# Patient Record
Sex: Male | Born: 1960 | Race: White | Hispanic: No | Marital: Married | Smoking: Former smoker
Health system: Southern US, Community
[De-identification: ages and names within clinical notes are randomized; demographics above are authoritative.]

## PROBLEM LIST (undated history)

## (undated) DIAGNOSIS — N200 Calculus of kidney: Secondary | ICD-10-CM

## (undated) DIAGNOSIS — I10 Essential (primary) hypertension: Secondary | ICD-10-CM

## (undated) DIAGNOSIS — Z87442 Personal history of urinary calculi: Secondary | ICD-10-CM

## (undated) DIAGNOSIS — I499 Cardiac arrhythmia, unspecified: Secondary | ICD-10-CM

## (undated) DIAGNOSIS — IMO0002 Reserved for concepts with insufficient information to code with codable children: Secondary | ICD-10-CM

## (undated) DIAGNOSIS — E785 Hyperlipidemia, unspecified: Secondary | ICD-10-CM

## (undated) DIAGNOSIS — M179 Osteoarthritis of knee, unspecified: Secondary | ICD-10-CM

## (undated) DIAGNOSIS — M1710 Unilateral primary osteoarthritis, unspecified knee: Secondary | ICD-10-CM

## (undated) DIAGNOSIS — K219 Gastro-esophageal reflux disease without esophagitis: Secondary | ICD-10-CM

## (undated) HISTORY — PX: FRACTURE SURGERY: SHX138

## (undated) HISTORY — DX: Osteoarthritis of knee, unspecified: M17.9

## (undated) HISTORY — DX: Hyperlipidemia, unspecified: E78.5

## (undated) HISTORY — PX: FOOT SURGERY: SHX648

## (undated) HISTORY — DX: Rider (driver) (passenger) of other motorcycle injured in unspecified traffic accident, initial encounter: V29.99XA

## (undated) HISTORY — PX: BACK SURGERY: SHX140

## (undated) HISTORY — DX: Unilateral primary osteoarthritis, unspecified knee: M17.10

## (undated) HISTORY — DX: Cardiac arrhythmia, unspecified: I49.9

## (undated) HISTORY — DX: Reserved for concepts with insufficient information to code with codable children: IMO0002

---

## 1999-03-19 ENCOUNTER — Ambulatory Visit (HOSPITAL_COMMUNITY): Admission: RE | Admit: 1999-03-19 | Discharge: 1999-03-19 | Payer: Self-pay | Admitting: Orthopedic Surgery

## 1999-03-19 ENCOUNTER — Encounter: Payer: Self-pay | Admitting: Orthopedic Surgery

## 1999-04-12 ENCOUNTER — Encounter: Payer: Self-pay | Admitting: Orthopedic Surgery

## 1999-04-12 ENCOUNTER — Observation Stay (HOSPITAL_COMMUNITY): Admission: RE | Admit: 1999-04-12 | Discharge: 1999-04-13 | Payer: Self-pay | Admitting: Orthopedic Surgery

## 2000-01-18 ENCOUNTER — Ambulatory Visit (HOSPITAL_COMMUNITY): Admission: RE | Admit: 2000-01-18 | Discharge: 2000-01-18 | Payer: Self-pay | Admitting: Urology

## 2000-10-25 ENCOUNTER — Ambulatory Visit (HOSPITAL_COMMUNITY): Admission: RE | Admit: 2000-10-25 | Discharge: 2000-10-25 | Payer: Self-pay | Admitting: Orthopedic Surgery

## 2003-07-22 ENCOUNTER — Ambulatory Visit (HOSPITAL_COMMUNITY): Admission: RE | Admit: 2003-07-22 | Discharge: 2003-07-22 | Payer: Self-pay | Admitting: Orthopedic Surgery

## 2003-08-20 ENCOUNTER — Ambulatory Visit (HOSPITAL_BASED_OUTPATIENT_CLINIC_OR_DEPARTMENT_OTHER): Admission: RE | Admit: 2003-08-20 | Discharge: 2003-08-20 | Payer: Self-pay | Admitting: Orthopedic Surgery

## 2003-12-04 ENCOUNTER — Ambulatory Visit (HOSPITAL_BASED_OUTPATIENT_CLINIC_OR_DEPARTMENT_OTHER): Admission: RE | Admit: 2003-12-04 | Discharge: 2003-12-04 | Payer: Self-pay | Admitting: Orthopedic Surgery

## 2010-10-25 ENCOUNTER — Ambulatory Visit (HOSPITAL_COMMUNITY)
Admission: RE | Admit: 2010-10-25 | Discharge: 2010-10-25 | Disposition: A | Discharge status: Home / Self Care | Payer: Commercial Managed Care - PPO | Source: Ambulatory Visit | Attending: Urology | Admitting: Urology

## 2010-10-25 DIAGNOSIS — Z01818 Encounter for other preprocedural examination: Secondary | ICD-10-CM | POA: Insufficient documentation

## 2010-10-25 DIAGNOSIS — N200 Calculus of kidney: Secondary | ICD-10-CM | POA: Insufficient documentation

## 2010-10-25 DIAGNOSIS — K219 Gastro-esophageal reflux disease without esophagitis: Secondary | ICD-10-CM | POA: Insufficient documentation

## 2010-10-25 DIAGNOSIS — I10 Essential (primary) hypertension: Secondary | ICD-10-CM | POA: Insufficient documentation

## 2010-10-29 ENCOUNTER — Emergency Department (HOSPITAL_COMMUNITY)
Admission: EM | Admit: 2010-10-29 | Discharge: 2010-10-29 | Disposition: A | Discharge status: Home / Self Care | Payer: Commercial Managed Care - PPO | Attending: Emergency Medicine | Admitting: Emergency Medicine

## 2010-10-29 ENCOUNTER — Emergency Department (HOSPITAL_COMMUNITY): Payer: Commercial Managed Care - PPO

## 2010-10-29 DIAGNOSIS — N200 Calculus of kidney: Secondary | ICD-10-CM | POA: Insufficient documentation

## 2010-10-29 DIAGNOSIS — R109 Unspecified abdominal pain: Secondary | ICD-10-CM | POA: Insufficient documentation

## 2010-10-29 DIAGNOSIS — Z9889 Other specified postprocedural states: Secondary | ICD-10-CM | POA: Insufficient documentation

## 2010-10-29 LAB — URINALYSIS, ROUTINE W REFLEX MICROSCOPIC
Ketones, ur: NEGATIVE mg/dL
Leukocytes, UA: NEGATIVE
Nitrite: NEGATIVE
Specific Gravity, Urine: 1.021 (ref 1.005–1.030)
Urobilinogen, UA: 0.2 mg/dL (ref 0.0–1.0)
pH: 5 (ref 5.0–8.0)

## 2010-10-29 LAB — URINE MICROSCOPIC-ADD ON

## 2011-01-30 ENCOUNTER — Other Ambulatory Visit: Payer: Self-pay

## 2011-01-30 ENCOUNTER — Emergency Department (HOSPITAL_COMMUNITY): Payer: Commercial Managed Care - PPO

## 2011-01-30 ENCOUNTER — Inpatient Hospital Stay (HOSPITAL_COMMUNITY)
Admission: EM | Admit: 2011-01-30 | Discharge: 2011-02-01 | DRG: 493 | Disposition: A | Discharge status: Home / Self Care | Payer: Commercial Managed Care - PPO | Attending: General Surgery | Admitting: General Surgery

## 2011-01-30 DIAGNOSIS — S82143A Displaced bicondylar fracture of unspecified tibia, initial encounter for closed fracture: Secondary | ICD-10-CM | POA: Diagnosis present

## 2011-01-30 DIAGNOSIS — S060X9A Concussion with loss of consciousness of unspecified duration, initial encounter: Secondary | ICD-10-CM

## 2011-01-30 DIAGNOSIS — Y92410 Unspecified street and highway as the place of occurrence of the external cause: Secondary | ICD-10-CM

## 2011-01-30 DIAGNOSIS — S82209A Unspecified fracture of shaft of unspecified tibia, initial encounter for closed fracture: Secondary | ICD-10-CM

## 2011-01-30 DIAGNOSIS — S82109A Unspecified fracture of upper end of unspecified tibia, initial encounter for closed fracture: Principal | ICD-10-CM | POA: Diagnosis present

## 2011-01-30 DIAGNOSIS — S060XAA Concussion with loss of consciousness status unknown, initial encounter: Secondary | ICD-10-CM | POA: Diagnosis present

## 2011-01-30 LAB — POCT I-STAT, CHEM 8
Calcium, Ion: 1.11 mmol/L — ABNORMAL LOW (ref 1.12–1.32)
Chloride: 102 mEq/L (ref 96–112)
Creatinine, Ser: 0.8 mg/dL (ref 0.50–1.35)
Glucose, Bld: 109 mg/dL — ABNORMAL HIGH (ref 70–99)
HCT: 47 % (ref 39.0–52.0)

## 2011-01-30 LAB — COMPREHENSIVE METABOLIC PANEL
ALT: 43 U/L (ref 0–53)
Alkaline Phosphatase: 71 U/L (ref 39–117)
BUN: 18 mg/dL (ref 6–23)
CO2: 25 mEq/L (ref 19–32)
Chloride: 98 mEq/L (ref 96–112)
GFR calc Af Amer: >90 mL/min (ref >90)
GFR calc non Af Amer: >90 mL/min (ref >90)
Glucose, Bld: 99 mg/dL (ref 70–99)
Potassium: 3 mEq/L — ABNORMAL LOW (ref 3.5–5.1)
Sodium: 135 mEq/L (ref 135–145)
Total Bilirubin: 0.5 mg/dL (ref 0.3–1.2)
Total Protein: 7.6 g/dL (ref 6.0–8.3)

## 2011-01-30 LAB — CARDIAC PANEL(CRET KIN+CKTOT+MB+TROPI)
Relative Index: 2.9 — ABNORMAL HIGH (ref 0.0–2.5)
Troponin I: <0.3 ng/mL (ref <0.30)

## 2011-01-30 LAB — CBC
HCT: 43.7 % (ref 39.0–52.0)
Hemoglobin: 15.8 g/dL (ref 13.0–17.0)
MCHC: 36.2 g/dL — ABNORMAL HIGH (ref 30.0–36.0)
RBC: 5.16 MIL/uL (ref 4.22–5.81)

## 2011-01-30 MED ORDER — FENTANYL CITRATE 0.05 MG/ML IJ SOLN
50.0000 ug | Freq: Once | INTRAMUSCULAR | Status: AC
  Administered 2011-01-30: 50 ug via INTRAVENOUS
  Filled 2011-01-30: qty 2

## 2011-01-30 NOTE — ED Notes (Signed)
Family updated as to patient's status.
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
## 2011-01-30 NOTE — Progress Notes (Signed)
Orthopedic Tech Progress Note Patient Details:  Gregg Pierce Jul 02, 1960 [id number]  Other Ortho Devices Type of Ortho Device: Knee Immobilizer Ortho Device Location: (R) LE Ortho Device Interventions: Application   Jennye Moccasin 01/30/2011, 9:17 PM

## 2011-01-30 NOTE — ED Notes (Signed)
Family at beside. Pt resting. Pt waiting for bed request.

## 2011-01-30 NOTE — ED Notes (Signed)
Family at beside. Family given emotional support. 

## 2011-01-30 NOTE — ED Notes (Signed)
Ortho consult called to  Dr. Shon Baton to [phone]
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
## 2011-01-30 NOTE — ED Notes (Signed)
Ortho MD at bedside aware that pt is in CT will see pt in CT. Ortho tech called for knee immobilizer.
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
## 2011-01-30 NOTE — ED Notes (Signed)
Bacitracin and dry dressing applied to abrassion on right knee.

## 2011-01-31 ENCOUNTER — Encounter (HOSPITAL_COMMUNITY): Payer: Self-pay | Admitting: *Deleted

## 2011-01-31 ENCOUNTER — Encounter (HOSPITAL_COMMUNITY): Payer: Self-pay | Admitting: Anesthesiology

## 2011-01-31 ENCOUNTER — Inpatient Hospital Stay (HOSPITAL_COMMUNITY): Payer: Commercial Managed Care - PPO | Admitting: Anesthesiology

## 2011-01-31 ENCOUNTER — Inpatient Hospital Stay (HOSPITAL_COMMUNITY): Payer: Commercial Managed Care - PPO

## 2011-01-31 ENCOUNTER — Encounter (HOSPITAL_COMMUNITY): Admission: EM | Disposition: A | Discharge status: Home / Self Care | Payer: Self-pay | Source: Home / Self Care

## 2011-01-31 DIAGNOSIS — S060XAA Concussion with loss of consciousness status unknown, initial encounter: Secondary | ICD-10-CM | POA: Diagnosis present

## 2011-01-31 DIAGNOSIS — S82143A Displaced bicondylar fracture of unspecified tibia, initial encounter for closed fracture: Secondary | ICD-10-CM | POA: Diagnosis present

## 2011-01-31 DIAGNOSIS — Z7289 Other problems related to lifestyle: Secondary | ICD-10-CM | POA: Insufficient documentation

## 2011-01-31 DIAGNOSIS — N200 Calculus of kidney: Secondary | ICD-10-CM | POA: Insufficient documentation

## 2011-01-31 HISTORY — PX: ORIF TIBIA PLATEAU: SHX2132

## 2011-01-31 LAB — CBC
HCT: 38.5 % — ABNORMAL LOW (ref 39.0–52.0)
MCH: 30.7 pg (ref 26.0–34.0)
MCV: 85.7 fL (ref 78.0–100.0)
Platelets: 208 10*3/uL (ref 150–400)
RBC: 4.49 MIL/uL (ref 4.22–5.81)

## 2011-01-31 LAB — BASIC METABOLIC PANEL
BUN: 18 mg/dL (ref 6–23)
CO2: 27 mEq/L (ref 19–32)
Calcium: 8.6 mg/dL (ref 8.4–10.5)
Creatinine, Ser: 0.77 mg/dL (ref 0.50–1.35)

## 2011-01-31 LAB — SURGICAL PCR SCREEN: MRSA, PCR: NEGATIVE

## 2011-01-31 SURGERY — OPEN REDUCTION INTERNAL FIXATION (ORIF) TIBIAL PLATEAU
Anesthesia: General | Laterality: Right

## 2011-01-31 MED ORDER — ROCURONIUM BROMIDE 100 MG/10ML IV SOLN
INTRAVENOUS | Status: DC | PRN
  Administered 2011-01-31: 50 mg via INTRAVENOUS

## 2011-01-31 MED ORDER — MORPHINE SULFATE 4 MG/ML IJ SOLN
INTRAMUSCULAR | Status: AC
  Administered 2011-01-31: 4 mg via INTRAVENOUS
  Filled 2011-01-31: qty 1

## 2011-01-31 MED ORDER — SODIUM CHLORIDE 0.9 % IJ SOLN: 9.0000 mL | INTRAMUSCULAR | Status: DC | PRN

## 2011-01-31 MED ORDER — FENTANYL CITRATE 0.05 MG/ML IJ SOLN
INTRAMUSCULAR | Status: DC | PRN
  Administered 2011-01-31: 50 ug via INTRAVENOUS
  Administered 2011-01-31: 100 ug via INTRAVENOUS
  Administered 2011-01-31: 50 ug via INTRAVENOUS
  Administered 2011-01-31: 100 ug via INTRAVENOUS
  Administered 2011-01-31: 50 ug via INTRAVENOUS

## 2011-01-31 MED ORDER — MORPHINE SULFATE 2 MG/ML IJ SOLN
6.0000 mg | INTRAMUSCULAR | Status: DC | PRN
  Administered 2011-01-31: 6 mg via INTRAVENOUS

## 2011-01-31 MED ORDER — OXYCODONE HCL 5 MG PO TABS
10.0000 mg | ORAL_TABLET | ORAL | Status: DC | PRN
  Administered 2011-01-31 (×2): 10 mg via ORAL
  Filled 2011-01-31 (×2): qty 2

## 2011-01-31 MED ORDER — HYDROMORPHONE 0.3 MG/ML IV SOLN
INTRAVENOUS | Status: DC
  Administered 2011-01-31: 7.5 mg via INTRAVENOUS
  Administered 2011-02-01: 0.9 mg via INTRAVENOUS
  Administered 2011-02-01: 0.3 mg via INTRAVENOUS
  Filled 2011-01-31 (×2): qty 25

## 2011-01-31 MED ORDER — ACETAMINOPHEN 10 MG/ML IV SOLN
INTRAVENOUS | Status: DC | PRN
  Administered 2011-01-31: 1000 mg via INTRAVENOUS

## 2011-01-31 MED ORDER — METHOCARBAMOL 500 MG PO TABS
500.0000 mg | ORAL_TABLET | Freq: Four times a day (QID) | ORAL | Status: DC | PRN
  Administered 2011-02-01: 500 mg via ORAL
  Filled 2011-01-31: qty 1

## 2011-01-31 MED ORDER — LACTATED RINGERS IV SOLN
INTRAVENOUS | Status: DC | PRN
  Administered 2011-01-31 (×2): via INTRAVENOUS

## 2011-01-31 MED ORDER — MORPHINE SULFATE 2 MG/ML IJ SOLN
2.0000 mg | INTRAMUSCULAR | Status: DC | PRN
  Filled 2011-01-31: qty 1

## 2011-01-31 MED ORDER — OXYCODONE HCL 5 MG PO TABS: 10.0000 mg | ORAL_TABLET | ORAL | Status: DC | PRN

## 2011-01-31 MED ORDER — HYDROCODONE-ACETAMINOPHEN 5-325 MG PO TABS: 1.0000 | ORAL_TABLET | ORAL | Status: DC | PRN

## 2011-01-31 MED ORDER — DIPHENHYDRAMINE HCL 50 MG/ML IJ SOLN: 12.5000 mg | Freq: Four times a day (QID) | INTRAMUSCULAR | Status: DC | PRN

## 2011-01-31 MED ORDER — ONDANSETRON HCL 4 MG/2ML IJ SOLN: 4.0000 mg | Freq: Four times a day (QID) | INTRAMUSCULAR | Status: DC | PRN

## 2011-01-31 MED ORDER — MORPHINE SULFATE 2 MG/ML IJ SOLN
4.0000 mg | INTRAMUSCULAR | Status: DC | PRN
  Administered 2011-01-31: 4 mg via INTRAVENOUS
  Filled 2011-01-31 (×2): qty 2

## 2011-01-31 MED ORDER — HYDROMORPHONE HCL PF 1 MG/ML IJ SOLN
0.2500 mg | INTRAMUSCULAR | Status: DC | PRN
  Administered 2011-01-31: 0.5 mg via INTRAVENOUS

## 2011-01-31 MED ORDER — ACETAMINOPHEN 10 MG/ML IV SOLN
1000.0000 mg | Freq: Four times a day (QID) | INTRAVENOUS | Status: DC
  Administered 2011-02-01 (×3): 1000 mg via INTRAVENOUS
  Filled 2011-01-31 (×4): qty 100

## 2011-01-31 MED ORDER — GLYCOPYRROLATE 0.2 MG/ML IJ SOLN
INTRAMUSCULAR | Status: DC | PRN
  Administered 2011-01-31: .4 mg via INTRAVENOUS

## 2011-01-31 MED ORDER — METOCLOPRAMIDE HCL 5 MG/ML IJ SOLN: 5.0000 mg | Freq: Three times a day (TID) | INTRAMUSCULAR | Status: DC | PRN

## 2011-01-31 MED ORDER — OXYCODONE HCL 5 MG PO TABS: 5.0000 mg | ORAL_TABLET | ORAL | Status: DC | PRN

## 2011-01-31 MED ORDER — ACETAMINOPHEN 10 MG/ML IV SOLN
INTRAVENOUS | Status: AC
  Filled 2011-01-31: qty 100

## 2011-01-31 MED ORDER — ONDANSETRON HCL 4 MG PO TABS: 4.0000 mg | ORAL_TABLET | Freq: Four times a day (QID) | ORAL | Status: DC | PRN

## 2011-01-31 MED ORDER — NALOXONE HCL 0.4 MG/ML IJ SOLN: 0.4000 mg | INTRAMUSCULAR | Status: DC | PRN

## 2011-01-31 MED ORDER — PROPOFOL 10 MG/ML IV EMUL
INTRAVENOUS | Status: DC | PRN
  Administered 2011-01-31: 200 mg via INTRAVENOUS

## 2011-01-31 MED ORDER — BISACODYL 10 MG RE SUPP: 10.0000 mg | Freq: Every day | RECTAL | Status: DC | PRN

## 2011-01-31 MED ORDER — PROMETHAZINE HCL 25 MG/ML IJ SOLN: 6.2500 mg | INTRAMUSCULAR | Status: DC | PRN

## 2011-01-31 MED ORDER — MIDAZOLAM HCL 5 MG/5ML IJ SOLN
INTRAMUSCULAR | Status: DC | PRN
  Administered 2011-01-31: 2 mg via INTRAVENOUS

## 2011-01-31 MED ORDER — NEOSTIGMINE METHYLSULFATE 1 MG/ML IJ SOLN
INTRAMUSCULAR | Status: DC | PRN
  Administered 2011-01-31: 3 mg via INTRAVENOUS

## 2011-01-31 MED ORDER — METHOCARBAMOL 100 MG/ML IJ SOLN: 500.0000 mg | Freq: Four times a day (QID) | INTRAVENOUS | Status: DC | PRN

## 2011-01-31 MED ORDER — SODIUM CHLORIDE 0.9 % IV SOLN
INTRAVENOUS | Status: DC
  Administered 2011-01-31 (×3): via INTRAVENOUS

## 2011-01-31 MED ORDER — ONDANSETRON HCL 4 MG/2ML IJ SOLN
INTRAMUSCULAR | Status: DC | PRN
  Administered 2011-01-31: 4 mg via INTRAVENOUS

## 2011-01-31 MED ORDER — CEFAZOLIN SODIUM 1-5 GM-% IV SOLN
INTRAVENOUS | Status: DC | PRN
  Administered 2011-01-31: 2 g via INTRAVENOUS

## 2011-01-31 MED ORDER — CEFAZOLIN SODIUM-DEXTROSE 2-3 GM-% IV SOLR
2.0000 g | Freq: Four times a day (QID) | INTRAVENOUS | Status: AC
  Administered 2011-01-31 – 2011-02-01 (×3): 2 g via INTRAVENOUS
  Filled 2011-01-31 (×3): qty 50

## 2011-01-31 MED ORDER — DOCUSATE SODIUM 100 MG PO CAPS
100.0000 mg | ORAL_CAPSULE | Freq: Two times a day (BID) | ORAL | Status: DC
  Administered 2011-01-31 – 2011-02-01 (×3): 100 mg via ORAL
  Filled 2011-01-31 (×6): qty 1

## 2011-01-31 MED ORDER — MEPERIDINE HCL 25 MG/ML IJ SOLN: 6.2500 mg | INTRAMUSCULAR | Status: DC | PRN

## 2011-01-31 MED ORDER — DIPHENHYDRAMINE HCL 12.5 MG/5ML PO ELIX
12.5000 mg | ORAL_SOLUTION | Freq: Four times a day (QID) | ORAL | Status: DC | PRN
  Filled 2011-01-31: qty 5

## 2011-01-31 MED ORDER — METOCLOPRAMIDE HCL 10 MG PO TABS: 5.0000 mg | ORAL_TABLET | Freq: Three times a day (TID) | ORAL | Status: DC | PRN

## 2011-01-31 MED ORDER — OXYCODONE-ACETAMINOPHEN 5-325 MG PO TABS: 1.0000 | ORAL_TABLET | ORAL | Status: DC | PRN

## 2011-01-31 MED ORDER — SODIUM CHLORIDE 0.9 % IV SOLN
INTRAVENOUS | Status: DC
  Administered 2011-01-31: 20 mL/h via INTRAVENOUS

## 2011-01-31 MED ORDER — TRAMADOL HCL 50 MG PO TABS
100.0000 mg | ORAL_TABLET | Freq: Four times a day (QID) | ORAL | Status: DC
  Administered 2011-01-31 – 2011-02-01 (×5): 100 mg via ORAL
  Filled 2011-01-31 (×5): qty 2

## 2011-01-31 SURGICAL SUPPLY — 70 items
BANDAGE GAUZE ELAST BULKY 4 IN (GAUZE/BANDAGES/DRESSINGS) ×2 IMPLANT
BIT DRILL 100X2.5XANTM LCK (BIT) IMPLANT
BIT DRILL CAL (BIT) IMPLANT
BIT DRILL SLEEVE MEASURING (BIT) IMPLANT
BIT DRL 100X2.5XANTM LCK (BIT) ×1
BLADE SURG 10 STRL SS (BLADE) ×2 IMPLANT
BLADE SURG ROTATE 9660 (MISCELLANEOUS) IMPLANT
BNDG COHESIVE 6X5 TAN STRL LF (GAUZE/BANDAGES/DRESSINGS) ×3 IMPLANT
BONE CANC CHIPS 20CC PCAN1/4 (Bone Implant) ×2 IMPLANT
CHIPS CANC BONE 20CC PCAN1/4 (Bone Implant) ×1 IMPLANT
CLEANER TIP ELECTROSURG 2X2 (MISCELLANEOUS) ×2 IMPLANT
CLOTH BEACON ORANGE TIMEOUT ST (SAFETY) ×2 IMPLANT
COVER MAYO STAND STRL (DRAPES) ×2 IMPLANT
CUFF TOURNIQUET SINGLE 34IN LL (TOURNIQUET CUFF) IMPLANT
CUFF TOURNIQUET SINGLE 44IN (TOURNIQUET CUFF) IMPLANT
DRAPE C-ARM 42X72 X-RAY (DRAPES) ×1 IMPLANT
DRAPE INCISE IOBAN 66X45 STRL (DRAPES) ×2 IMPLANT
DRAPE U-SHAPE 47X51 STRL (DRAPES) ×2 IMPLANT
DRILL BIT 2.5MM (BIT) ×2
DRILL BIT CAL (BIT) ×2
DRILL SLEEVE MEASURING (BIT) ×2
DRSG ADAPTIC 3X8 NADH LF (GAUZE/BANDAGES/DRESSINGS) ×2 IMPLANT
DRSG PAD ABDOMINAL 8X10 ST (GAUZE/BANDAGES/DRESSINGS) ×3 IMPLANT
DURAPREP 26ML APPLICATOR (WOUND CARE) ×2 IMPLANT
ELECT REM PT RETURN 9FT ADLT (ELECTROSURGICAL) ×2
ELECTRODE REM PT RTRN 9FT ADLT (ELECTROSURGICAL) ×1 IMPLANT
EVACUATOR 1/8 PVC DRAIN (DRAIN) IMPLANT
GAUZE KERLIX 2  STERILE LF (GAUZE/BANDAGES/DRESSINGS) ×1 IMPLANT
GLOVE BIOGEL PI IND STRL 9 (GLOVE) ×1 IMPLANT
GLOVE BIOGEL PI INDICATOR 9 (GLOVE) ×1
GLOVE SURG ORTHO 9.0 STRL STRW (GLOVE) ×2 IMPLANT
GOWN PREVENTION PLUS XLARGE (GOWN DISPOSABLE) ×2 IMPLANT
GOWN SRG XL XLNG 56XLVL 4 (GOWN DISPOSABLE) ×2 IMPLANT
GOWN STRL NON-REIN XL XLG LVL4 (GOWN DISPOSABLE) ×4
GRAFT BNE CANC CHIPS 1-8 20CC (Bone Implant) IMPLANT
K-WIRE ACE 1.6X6 (WIRE) ×4
KIT BASIN OR (CUSTOM PROCEDURE TRAY) ×2 IMPLANT
KIT ROOM TURNOVER OR (KITS) ×2 IMPLANT
KWIRE ACE 1.6X6 (WIRE) IMPLANT
MANIFOLD NEPTUNE II (INSTRUMENTS) ×2 IMPLANT
NEEDLE 22X1 1/2 (OR ONLY) (NEEDLE) ×2 IMPLANT
NS IRRIG 1000ML POUR BTL (IV SOLUTION) ×2 IMPLANT
PACK ORTHO EXTREMITY (CUSTOM PROCEDURE TRAY) ×2 IMPLANT
PAD ABD 7.5X8 STRL (GAUZE/BANDAGES/DRESSINGS) ×1 IMPLANT
PAD ARMBOARD 7.5X6 YLW CONV (MISCELLANEOUS) ×4 IMPLANT
PLATE LOCK 5H STD RT PROX TIB (Plate) ×1 IMPLANT
SCREW CORTICAL 3.5MM  42MM (Screw) ×1 IMPLANT
SCREW CORTICAL 3.5MM 42MM (Screw) IMPLANT
SCREW CORTICAL 3.5MM 48MM (Screw) ×1 IMPLANT
SCREW LOCK CANC STAR 4X55 (Screw) ×2 IMPLANT
SCREW LOCK CANC STAR 4X65 (Screw) ×2 IMPLANT
SCREW LP 3.5X85MM (Screw) ×3 IMPLANT
SPONGE GAUZE 4X4 12PLY (GAUZE/BANDAGES/DRESSINGS) ×3 IMPLANT
SPONGE LAP 18X18 X RAY DECT (DISPOSABLE) ×3 IMPLANT
STAPLER VISISTAT 35W (STAPLE) IMPLANT
STOCKINETTE IMPERVIOUS LG (DRAPES) ×2 IMPLANT
SUCTION FRAZIER TIP 10 FR DISP (SUCTIONS) ×2 IMPLANT
SUT ETHILON 2 0 PSLX (SUTURE) IMPLANT
SUT ETHILON 4 0 PS 2 18 (SUTURE) IMPLANT
SUT VIC AB 0 CTB1 27 (SUTURE) ×2 IMPLANT
SUT VIC AB 1 CTB1 27 (SUTURE) ×2 IMPLANT
SUT VIC AB 1 CTX 36 (SUTURE) ×2
SUT VIC AB 1 CTX36XBRD ANBCTR (SUTURE) IMPLANT
SUT VIC AB 2-0 CTB1 (SUTURE) ×2 IMPLANT
SYR 20ML ECCENTRIC (SYRINGE) ×2 IMPLANT
TOWEL OR 17X24 6PK STRL BLUE (TOWEL DISPOSABLE) ×2 IMPLANT
TOWEL OR 17X26 10 PK STRL BLUE (TOWEL DISPOSABLE) ×2 IMPLANT
TUBE CONNECTING 12X1/4 (SUCTIONS) ×2 IMPLANT
WATER STERILE IRR 1000ML POUR (IV SOLUTION) ×4 IMPLANT
YANKAUER SUCT BULB TIP NO VENT (SUCTIONS) ×2 IMPLANT
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
## 2011-01-31 NOTE — Anesthesia Preprocedure Evaluation (Addendum)
Anesthesia Evaluation  Patient identified by MRN, date of birth, ID band Patient awake    Reviewed: Allergy & Precautions, H&P , NPO status , Patient's Chart, lab work & pertinent test results  Airway Mallampati: I TM Distance: >3 FB Neck ROM: full    Dental No notable dental hx. (+) Edentulous Upper   Pulmonary neg pulmonary ROS,  clear to auscultation  Pulmonary exam normal       Cardiovascular neg cardio ROS regular Normal    Neuro/Psych Negative Neurological ROS  Negative Psych ROS   GI/Hepatic negative GI ROS, Neg liver ROS,   Endo/Other  Negative Endocrine ROS  Renal/GU negative Renal ROS  Genitourinary negative   Musculoskeletal   Abdominal   Peds  Hematology negative hematology ROS (+)   Anesthesia Other Findings   Reproductive/Obstetrics negative OB ROS                           Anesthesia Physical Anesthesia Plan  ASA: II  Anesthesia Plan: General   Post-op Pain Management:    Induction: Intravenous  Airway Management Planned: Oral ETT  Additional Equipment:   Intra-op Plan:   Post-operative Plan: Extubation in OR  Informed Consent: I have reviewed the patients History and Physical, chart, labs and discussed the procedure including the risks, benefits and alternatives for the proposed anesthesia with the patient or authorized representative who has indicated his/her understanding and acceptance.   Dental advisory given  Plan Discussed with: CRNA, Anesthesiologist and Surgeon  Anesthesia Plan Comments:        Anesthesia Quick Evaluation

## 2011-01-31 NOTE — Progress Notes (Signed)
Subjective:     Patient reports pain as 7 on 0-10 scale.     Objective: Vital signs in last 24 hours: Temp:  [98 F (36.7 C)-98.8 F (37.1 C)] 98.8 F (37.1 C) (12/03 0640) Pulse Rate:  [60-102] 79  (12/03 0640) Resp:  [16-20] 16  (12/03 0640) BP: (102-140)/(58-90) 136/81 mmHg (12/03 0640) SpO2:  [96 %-100 %] 99 % (12/03 0640) Weight:  [112.038 kg (247 lb)] 247 lb (112.038 kg) (12/03 0046)  Intake/Output from previous day: 12/02 0701 - 12/03 0700 In: 461.9 [I.V.:461.9] Out: 600 [Urine:600] Intake/Output this shift:     Basename 01/31/11 0500 01/30/11 1820 01/30/11 1741  HGB 13.8 15.8 16.0    Basename 01/31/11 0500 01/30/11 1820  WBC 12.2* 12.0*  RBC 4.49 5.16  HCT 38.5* 43.7  PLT 208 262    Basename 01/31/11 0500 01/30/11 1820  NA 136 135  K 4.0 3.0*  CL 99 98  CO2 27 25  BUN 18 18  CREATININE 0.77 0.73  GLUCOSE 123* 99  CALCIUM 8.6 9.9   No results found for this basename: LABPT:2,INR:2 in the last 72 hours  Neurologically intact ABD soft Neurovascular intact Sensation intact distally Intact pulses distally  Assessment/Plan: patient stable. Requesting Dr Lajoyce Corners for definitive fracture care NPO for now - Dr Lajoyce Corners to eval and treat today   Mark Benecke D 01/31/2011, 8:29 AM

## 2011-01-31 NOTE — Progress Notes (Signed)
Patient ID: Gregg Pierce, male   DOB: 09/29/1960, 50 y.o.   MRN: [id number]   LOS: 1 day   Subjective: Pain reasonably well controlled at this point. Awaiting consult by Dr. Lajoyce Corners for fx.  Objective: Vital signs in last 24 hours: Temp:  [98 F (36.7 C)-98.8 F (37.1 C)] 98.8 F (37.1 C) (12/03 0640) Pulse Rate:  [60-102] 79  (12/03 0640) Resp:  [16-20] 16  (12/03 0640) BP: (102-140)/(58-90) 136/81 mmHg (12/03 0640) SpO2:  [96 %-100 %] 99 % (12/03 0640) Weight:  [112.038 kg (247 lb)] 247 lb (112.038 kg) (12/03 0046) Last BM Date: 01/30/11  Lab Results:  CBC  Basename 01/31/11 0500 01/30/11 1820  WBC 12.2* 12.0*  HGB 13.8 15.8  HCT 38.5* 43.7  PLT 208 262   BMET  Basename 01/31/11 0500 01/30/11 1820  NA 136 135  K 4.0 3.0*  CL 99 98  CO2 27 25  GLUCOSE 123* 99  BUN 18 18  CREATININE 0.77 0.73  CALCIUM 8.6 9.9    General appearance: alert and no distress Resp: clear to auscultation bilaterally Cardio: regular rate and rhythm GI: normal findings: bowel sounds normal and soft, non-tender Pulses: 2+ and symmetric  Assessment/Plan: [facility] Right tibia plateau fx -- Awaiting consult by Dr. Lajoyce Corners. Possibly OR today according to Dr. Shon Baton' note. Concussion -- No sequelae FEN -- No issues VTE -- SCD left. Will change to plexipulse bilaterally. Lovenox once OR status known. Dispo -- Dr. Isaac Laud, PA-C Pager: [phone] General Trauma PA Pager: [phone]   01/31/2011

## 2011-01-31 NOTE — Transfer of Care (Signed)
Immediate Anesthesia Transfer of Care Note  Patient: Gregg Pierce  Procedure(s) Performed:  OPEN REDUCTION INTERNAL FIXATION (ORIF) TIBIAL PLATEAU - Open Reduction Internal Fixation right tibial plateau  Patient Location: PACU  Anesthesia Type: General  Level of Consciousness: awake, alert  and patient cooperative  Airway & Oxygen Therapy: Patient Spontanous Breathing and Patient connected to nasal cannula oxygen  Post-op Assessment: Report given to PACU RN, Post -op Vital signs reviewed and stable and Patient moving all extremities  Post vital signs: Reviewed and stable  Complications: No apparent anesthesia complications

## 2011-01-31 NOTE — Progress Notes (Signed)
This patient has been seen and I agree with the findings and treatment plan.  Omeed Osuna O. Omran Keelin, III, MD, FACS [phone] (pager) [phone] (direct pager) Trauma Surgeon  
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
## 2011-02-01 LAB — COMPREHENSIVE METABOLIC PANEL
ALT: 25 U/L (ref 0–53)
Albumin: 3.2 g/dL — ABNORMAL LOW (ref 3.5–5.2)
Alkaline Phosphatase: 54 U/L (ref 39–117)
BUN: 15 mg/dL (ref 6–23)
Chloride: 100 mEq/L (ref 96–112)
Glucose, Bld: 102 mg/dL — ABNORMAL HIGH (ref 70–99)
Potassium: 4.1 mEq/L (ref 3.5–5.1)
Sodium: 137 mEq/L (ref 135–145)
Total Bilirubin: 1.3 mg/dL — ABNORMAL HIGH (ref 0.3–1.2)

## 2011-02-01 LAB — PROTIME-INR: Prothrombin Time: 14.8 seconds (ref 11.6–15.2)

## 2011-02-01 MED ORDER — METHOCARBAMOL 500 MG PO TABS: 500.0000 mg | ORAL_TABLET | Freq: Four times a day (QID) | ORAL | Status: AC | PRN

## 2011-02-01 MED ORDER — WARFARIN VIDEO: Freq: Once | Status: DC

## 2011-02-01 MED ORDER — ENOXAPARIN SODIUM 40 MG/0.4ML ~~LOC~~ SOLN: 40.0000 mg | SUBCUTANEOUS | Status: DC

## 2011-02-01 MED ORDER — WARFARIN SODIUM 5 MG PO TABS: 5.0000 mg | ORAL_TABLET | Freq: Every day | ORAL | Status: DC

## 2011-02-01 MED ORDER — WARFARIN SODIUM 1 MG PO TABS: 1.0000 mg | ORAL_TABLET | Freq: Every day | ORAL | Status: DC

## 2011-02-01 MED ORDER — COUMADIN BOOK
Freq: Once | Status: AC
  Administered 2011-02-01
  Filled 2011-02-01: qty 1

## 2011-02-01 MED ORDER — WARFARIN SODIUM 10 MG PO TABS
10.0000 mg | ORAL_TABLET | ORAL | Status: AC
  Administered 2011-02-01: 10 mg via ORAL
  Filled 2011-02-01: qty 1

## 2011-02-01 MED ORDER — WARFARIN SODIUM 7.5 MG PO TABS
7.5000 mg | ORAL_TABLET | Freq: Once | ORAL | Status: AC
  Administered 2011-02-01: 7.5 mg via ORAL
  Filled 2011-02-01: qty 1

## 2011-02-01 MED ORDER — ENOXAPARIN (LOVENOX) PATIENT EDUCATION KIT: 1.0000 | PACK | Freq: Once | Status: DC

## 2011-02-01 MED ORDER — TRAMADOL HCL 50 MG PO TABS: 50.0000 mg | ORAL_TABLET | Freq: Four times a day (QID) | ORAL | Status: AC

## 2011-02-01 MED ORDER — OXYCODONE-ACETAMINOPHEN 5-325 MG PO TABS: 1.0000 | ORAL_TABLET | ORAL | Status: AC | PRN

## 2011-02-01 NOTE — Progress Notes (Signed)
Subjective: 1 Day Post-Op Procedure(s) (LRB): OPEN REDUCTION INTERNAL FIXATION (ORIF) TIBIAL PLATEAU (Right) Patient feeling better    Objective: Vital signs in last 24 hours: Temp:  [97.8 F (36.6 C)-99.5 F (37.5 C)] 98.7 F (37.1 C) (12/04 0553) Pulse Rate:  [63-90] 77  (12/04 0553) Resp:  [11-20] 18  (12/04 0553) BP: (113-147)/(72-88) 124/78 mmHg (12/04 0553) SpO2:  [91 %-99 %] 94 % (12/04 0553)  Intake/Output from previous day: 12/03 0701 - 12/04 0700 In: 2175 [I.V.:2175] Out: 1400 [Urine:1300; Blood:100] Intake/Output this shift:     Basename 01/31/11 0500 01/30/11 1820 01/30/11 1741  HGB 13.8 15.8 16.0    Basename 01/31/11 0500 01/30/11 1820  WBC 12.2* 12.0*  RBC 4.49 5.16  HCT 38.5* 43.7  PLT 208 262    Basename 01/31/11 0500 01/30/11 1820  NA 136 135  K 4.0 3.0*  CL 99 98  CO2 27 25  BUN 18 18  CREATININE 0.77 0.73  GLUCOSE 123* 99  CALCIUM 8.6 9.9    Basename 02/01/11 0500  LABPT --  INR 1.14    Neurologically intact  Assessment/Plan: 1 Day Post-Op Procedure(s) (LRB): OPEN REDUCTION INTERNAL FIXATION (ORIF) TIBIAL PLATEAU (Right) Up with therapy, non weight bearing right leg, OK for D/C home when safe with PT  DUDA,MARCUS V 02/01/2011, 6:31 AM
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
## 2011-02-01 NOTE — ED Provider Notes (Signed)
I saw and evaluated the patient, reviewed the resident's note and I agree with the findings and plan.   Jlynn Langille A. Patrica Duel, MD 02/01/11 1035
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
## 2011-02-01 NOTE — Progress Notes (Signed)
ANTICOAGULATION CONSULT NOTE - Follow Up Consult  Pharmacy Consult for Coumadin Indication: VTE prophylaxis  No Known Allergies  Patient Measurements: Height: 6' (182.9 cm) Weight: 247 lb (112.038 kg) IBW/kg (Calculated) : 77.6  Adjusted Body Weight:    Vital Signs: Temp: 98.7 F (37.1 C) (12/04 0553) BP: 124/78 mmHg (12/04 0553) Pulse Rate: 77  (12/04 0553)  Labs:  Basename 02/01/11 0500 01/31/11 0500 01/30/11 1820 01/30/11 1741  HGB -- 13.8 15.8 --  HCT -- 38.5* 43.7 47.0  PLT -- 208 262 --  APTT -- -- -- --  LABPROT 14.8 -- -- --  INR 1.14 -- -- --  HEPARINUNFRC -- -- -- --  CREATININE 0.82 0.77 0.73 --  CKTOTAL -- -- 109 --  CKMB -- -- 3.2 --  TROPONINI -- -- <0.30 --   Estimated Creatinine Clearance: 137.8 ml/min (by C-G formula based on Cr of 0.82).   Medications:  Prescriptions prior to admission  Medication Sig Dispense Refill  . acetaminophen (TYLENOL) 325 MG tablet Take 650 mg by mouth every 6 (six) hours as needed. For pain       . aspirin 325 MG tablet Take 325 mg by mouth every 4 (four) hours as needed. For pain         Assessment: VTE prophx. s/p repair of tibial plateau fx.   Goal of Therapy:  INR 2-3   Plan:  Coumadin 7.5mg  po x 1 tonight.  Merilynn Finland, [employer] 02/01/2011,11:01 AM

## 2011-02-01 NOTE — Progress Notes (Signed)
Pt for discharge home today. [employer] coverage. Choice of HH agency given and pt chose [facility]. Arrangements made for rolling walker and HHPT, HHRN for Coumadin mgmt.

## 2011-02-01 NOTE — Anesthesia Postprocedure Evaluation (Signed)
  Anesthesia Post-op Note  Patient: Gregg Pierce  Procedure(s) Performed:  OPEN REDUCTION INTERNAL FIXATION (ORIF) TIBIAL PLATEAU - Open Reduction Internal Fixation right tibial plateau  Patient Location: PACU and Nursing Unit  Anesthesia Type: General  Level of Consciousness: awake and alert   Airway and Oxygen Therapy: Patient Spontanous Breathing  Post-op Pain: mild  Post-op Assessment: Post-op Vital signs reviewed and Patient's Cardiovascular Status Stable  Post-op Vital Signs: Reviewed  Complications: No apparent anesthesia complications

## 2011-02-02 ENCOUNTER — Encounter (HOSPITAL_COMMUNITY): Payer: Self-pay | Admitting: Orthopedic Surgery

## 2012-05-14 ENCOUNTER — Other Ambulatory Visit (HOSPITAL_COMMUNITY): Payer: Self-pay | Admitting: Orthopedic Surgery

## 2012-05-14 ENCOUNTER — Encounter (HOSPITAL_COMMUNITY): Payer: Self-pay | Admitting: Pharmacy Technician

## 2012-05-15 ENCOUNTER — Encounter (HOSPITAL_COMMUNITY): Payer: Self-pay

## 2012-05-15 MED ORDER — DEXTROSE 5 % IV SOLN
3.0000 g | INTRAVENOUS | Status: AC
  Administered 2012-05-16: 3 g via INTRAVENOUS
  Filled 2012-05-15: qty 3000

## 2012-05-16 ENCOUNTER — Ambulatory Visit (HOSPITAL_COMMUNITY)
Admission: RE | Admit: 2012-05-16 | Discharge: 2012-05-16 | Disposition: A | Discharge status: Home / Self Care | Payer: Commercial Managed Care - PPO | Source: Ambulatory Visit | Attending: Orthopedic Surgery | Admitting: Orthopedic Surgery

## 2012-05-16 ENCOUNTER — Encounter (HOSPITAL_COMMUNITY): Payer: Self-pay | Admitting: Certified Registered"

## 2012-05-16 ENCOUNTER — Encounter (HOSPITAL_COMMUNITY)
Admission: RE | Disposition: A | Discharge status: Home / Self Care | Payer: Self-pay | Source: Ambulatory Visit | Attending: Orthopedic Surgery

## 2012-05-16 ENCOUNTER — Ambulatory Visit (HOSPITAL_COMMUNITY): Payer: Commercial Managed Care - PPO

## 2012-05-16 ENCOUNTER — Ambulatory Visit (HOSPITAL_COMMUNITY): Payer: Commercial Managed Care - PPO | Admitting: Certified Registered"

## 2012-05-16 DIAGNOSIS — K219 Gastro-esophageal reflux disease without esophagitis: Secondary | ICD-10-CM | POA: Insufficient documentation

## 2012-05-16 DIAGNOSIS — S42001A Fracture of unspecified part of right clavicle, initial encounter for closed fracture: Secondary | ICD-10-CM

## 2012-05-16 DIAGNOSIS — I10 Essential (primary) hypertension: Secondary | ICD-10-CM | POA: Insufficient documentation

## 2012-05-16 DIAGNOSIS — S42023A Displaced fracture of shaft of unspecified clavicle, initial encounter for closed fracture: Secondary | ICD-10-CM | POA: Insufficient documentation

## 2012-05-16 DIAGNOSIS — Z79899 Other long term (current) drug therapy: Secondary | ICD-10-CM | POA: Insufficient documentation

## 2012-05-16 DIAGNOSIS — W19XXXA Unspecified fall, initial encounter: Secondary | ICD-10-CM | POA: Insufficient documentation

## 2012-05-16 HISTORY — DX: Calculus of kidney: N20.0

## 2012-05-16 HISTORY — PX: ORIF CLAVICULAR FRACTURE: SHX5055

## 2012-05-16 HISTORY — DX: Essential (primary) hypertension: I10

## 2012-05-16 HISTORY — DX: Gastro-esophageal reflux disease without esophagitis: K21.9

## 2012-05-16 LAB — COMPREHENSIVE METABOLIC PANEL
Albumin: 3.7 g/dL (ref 3.5–5.2)
BUN: 16 mg/dL (ref 6–23)
Calcium: 9 mg/dL (ref 8.4–10.5)
Creatinine, Ser: 0.71 mg/dL (ref 0.50–1.35)
Total Bilirubin: 1 mg/dL (ref 0.3–1.2)
Total Protein: 7.3 g/dL (ref 6.0–8.3)

## 2012-05-16 LAB — CBC
HCT: 42.5 % (ref 39.0–52.0)
MCH: 30.4 pg (ref 26.0–34.0)
MCHC: 36 g/dL (ref 30.0–36.0)
MCV: 84.5 fL (ref 78.0–100.0)
RDW: 12.5 % (ref 11.5–15.5)

## 2012-05-16 LAB — PROTIME-INR
INR: 1.02 (ref 0.00–1.49)
Prothrombin Time: 13.3 seconds (ref 11.6–15.2)

## 2012-05-16 LAB — SURGICAL PCR SCREEN
MRSA, PCR: NEGATIVE
Staphylococcus aureus: NEGATIVE

## 2012-05-16 SURGERY — OPEN REDUCTION INTERNAL FIXATION (ORIF) CLAVICULAR FRACTURE
Anesthesia: General | Site: Shoulder | Laterality: Right | Wound class: Clean

## 2012-05-16 MED ORDER — MIDAZOLAM HCL 5 MG/5ML IJ SOLN
INTRAMUSCULAR | Status: DC | PRN
  Administered 2012-05-16: 2 mg via INTRAVENOUS

## 2012-05-16 MED ORDER — ONDANSETRON HCL 4 MG/2ML IJ SOLN
INTRAMUSCULAR | Status: DC | PRN
  Administered 2012-05-16: 4 mg via INTRAVENOUS

## 2012-05-16 MED ORDER — GLYCOPYRROLATE 0.2 MG/ML IJ SOLN
INTRAMUSCULAR | Status: DC | PRN
  Administered 2012-05-16: 0.4 mg via INTRAVENOUS

## 2012-05-16 MED ORDER — BUPIVACAINE HCL 0.5 % IJ SOLN
INTRAMUSCULAR | Status: DC | PRN
  Administered 2012-05-16: 10 mL

## 2012-05-16 MED ORDER — LACTATED RINGERS IV SOLN
INTRAVENOUS | Status: DC | PRN
  Administered 2012-05-16 (×2): via INTRAVENOUS

## 2012-05-16 MED ORDER — FENTANYL CITRATE 0.05 MG/ML IJ SOLN
INTRAMUSCULAR | Status: DC | PRN
  Administered 2012-05-16 (×5): 50 ug via INTRAVENOUS

## 2012-05-16 MED ORDER — ACETAMINOPHEN 10 MG/ML IV SOLN: 1000.0000 mg | Freq: Once | INTRAVENOUS | Status: DC | PRN

## 2012-05-16 MED ORDER — ONDANSETRON HCL 4 MG/2ML IJ SOLN: 4.0000 mg | Freq: Once | INTRAMUSCULAR | Status: DC | PRN

## 2012-05-16 MED ORDER — ROCURONIUM BROMIDE 100 MG/10ML IV SOLN
INTRAVENOUS | Status: DC | PRN
  Administered 2012-05-16: 50 mg via INTRAVENOUS

## 2012-05-16 MED ORDER — BUPIVACAINE HCL (PF) 0.5 % IJ SOLN
INTRAMUSCULAR | Status: AC
  Filled 2012-05-16: qty 30

## 2012-05-16 MED ORDER — MUPIROCIN 2 % EX OINT
TOPICAL_OINTMENT | Freq: Once | CUTANEOUS | Status: AC
  Administered 2012-05-16: 07:00:00 via NASAL
  Filled 2012-05-16: qty 22

## 2012-05-16 MED ORDER — PHENYLEPHRINE HCL 10 MG/ML IJ SOLN
INTRAMUSCULAR | Status: DC | PRN
  Administered 2012-05-16: 80 ug via INTRAVENOUS

## 2012-05-16 MED ORDER — LIDOCAINE HCL (CARDIAC) 20 MG/ML IV SOLN
INTRAVENOUS | Status: DC | PRN
  Administered 2012-05-16: 50 mg via INTRAVENOUS

## 2012-05-16 MED ORDER — PROPOFOL 10 MG/ML IV BOLUS
INTRAVENOUS | Status: DC | PRN
  Administered 2012-05-16: 180 mg via INTRAVENOUS

## 2012-05-16 MED ORDER — HYDROMORPHONE HCL PF 1 MG/ML IJ SOLN: 0.2500 mg | INTRAMUSCULAR | Status: DC | PRN

## 2012-05-16 MED ORDER — MUPIROCIN 2 % EX OINT
TOPICAL_OINTMENT | CUTANEOUS | Status: AC
  Filled 2012-05-16: qty 22

## 2012-05-16 MED ORDER — NEOSTIGMINE METHYLSULFATE 1 MG/ML IJ SOLN
INTRAMUSCULAR | Status: DC | PRN
  Administered 2012-05-16: 3 mg via INTRAVENOUS

## 2012-05-16 MED ORDER — OXYCODONE-ACETAMINOPHEN 5-325 MG PO TABS: 1.0000 | ORAL_TABLET | ORAL | Status: DC | PRN

## 2012-05-16 MED ORDER — 0.9 % SODIUM CHLORIDE (POUR BTL) OPTIME
TOPICAL | Status: DC | PRN
  Administered 2012-05-16: 1000 mL

## 2012-05-16 SURGICAL SUPPLY — 52 items
APL SKNCLS STERI-STRIP NONHPOA (GAUZE/BANDAGES/DRESSINGS) ×1
BENZOIN TINCTURE PRP APPL 2/3 (GAUZE/BANDAGES/DRESSINGS) ×2 IMPLANT
BIT DRILL 2.8X5 QR DISP (BIT) ×1 IMPLANT
BLADE AVERAGE 25X9 (BLADE) IMPLANT
CLOTH BEACON ORANGE TIMEOUT ST (SAFETY) ×2 IMPLANT
COVER SURGICAL LIGHT HANDLE (MISCELLANEOUS) ×3 IMPLANT
DRAPE INCISE IOBAN 66X45 STRL (DRAPES) ×2 IMPLANT
DRAPE ORTHO SPLIT 77X108 STRL (DRAPES)
DRAPE SURG ORHT 6 SPLT 77X108 (DRAPES) ×1 IMPLANT
DRAPE U-SHAPE 47X51 STRL (DRAPES) ×3 IMPLANT
DRSG MEPILEX BORDER 4X8 (GAUZE/BANDAGES/DRESSINGS) ×2 IMPLANT
DRSG PAD ABDOMINAL 8X10 ST (GAUZE/BANDAGES/DRESSINGS) ×2 IMPLANT
DURAPREP 26ML APPLICATOR (WOUND CARE) ×2 IMPLANT
ELECT CAUTERY BLADE 6.4 (BLADE) ×2 IMPLANT
ELECT REM PT RETURN 9FT ADLT (ELECTROSURGICAL) ×2
ELECTRODE REM PT RTRN 9FT ADLT (ELECTROSURGICAL) ×1 IMPLANT
GAUZE XEROFORM 1X8 LF (GAUZE/BANDAGES/DRESSINGS) ×2 IMPLANT
GLOVE BIO SURGEON ST LM GN SZ9 (GLOVE) ×3 IMPLANT
GLOVE BIO SURGEON STRL SZ8.5 (GLOVE) ×1 IMPLANT
GLOVE BIOGEL PI IND STRL 9 (GLOVE) ×1 IMPLANT
GLOVE BIOGEL PI INDICATOR 9 (GLOVE) ×1
GLOVE SURG SS PI 8.5 STRL IVOR (GLOVE) ×1
GLOVE SURG SS PI 8.5 STRL STRW (GLOVE) IMPLANT
GOWN PREVENTION PLUS XLARGE (GOWN DISPOSABLE) ×1 IMPLANT
GOWN SRG XL XLNG 56XLVL 4 (GOWN DISPOSABLE) ×2 IMPLANT
GOWN STRL NON-REIN XL XLG LVL4 (GOWN DISPOSABLE) ×2
GOWN STRL REIN 2XL XLG LVL4 (GOWN DISPOSABLE) ×1 IMPLANT
KIT BASIN OR (CUSTOM PROCEDURE TRAY) ×2 IMPLANT
KIT ROOM TURNOVER OR (KITS) ×2 IMPLANT
MANIFOLD NEPTUNE II (INSTRUMENTS) ×2 IMPLANT
NDL HYPO 25GX1X1/2 BEV (NEEDLE) IMPLANT
NEEDLE HYPO 25GX1X1/2 BEV (NEEDLE) IMPLANT
NS IRRIG 1000ML POUR BTL (IV SOLUTION) ×2 IMPLANT
PACK SHOULDER (CUSTOM PROCEDURE TRAY) ×2 IMPLANT
PAD ARMBOARD 7.5X6 YLW CONV (MISCELLANEOUS) ×4 IMPLANT
PLATE CLAVICLE 8 HOLE (Plate) ×1 IMPLANT
SCREW CORT 3.5X14 (Screw) ×3 IMPLANT
SCREW CORT 3.5X16 (Screw) ×3 IMPLANT
SLING ARM IMMOBILIZER LRG (SOFTGOODS) IMPLANT
SLING ARM IMMOBILIZER XL (CAST SUPPLIES) ×1 IMPLANT
SPONGE GAUZE 4X4 12PLY (GAUZE/BANDAGES/DRESSINGS) ×2 IMPLANT
SPONGE LAP 4X18 X RAY DECT (DISPOSABLE) ×2 IMPLANT
STAPLER VISISTAT 35W (STAPLE) IMPLANT
STRIP CLOSURE SKIN 1/4X4 (GAUZE/BANDAGES/DRESSINGS) ×2 IMPLANT
SUCTION FRAZIER TIP 10 FR DISP (SUCTIONS) ×2 IMPLANT
SUT VIC AB 2-0 CT1 27 (SUTURE) ×2
SUT VIC AB 2-0 CT1 TAPERPNT 27 (SUTURE) ×1 IMPLANT
SUT VICRYL 4-0 PS2 18IN ABS (SUTURE) IMPLANT
SYR CONTROL 10ML LL (SYRINGE) IMPLANT
TOWEL OR 17X24 6PK STRL BLUE (TOWEL DISPOSABLE) ×2 IMPLANT
TOWEL OR 17X26 10 PK STRL BLUE (TOWEL DISPOSABLE) ×2 IMPLANT
WATER STERILE IRR 1000ML POUR (IV SOLUTION) ×1 IMPLANT

## 2012-05-16 NOTE — Anesthesia Postprocedure Evaluation (Signed)
  Anesthesia Post-op Note  Patient: Gregg Pierce  Procedure(s) Performed: Procedure(s) with comments: Open Reduction Internal Fixation Right Clavicle Fracture (Right) - Open Reduction Internal Fixation Right Clavicle Fracture  Patient Location: PACU  Anesthesia Type:General  Level of Consciousness: awake, alert  and oriented  Airway and Oxygen Therapy: Patient Spontanous Breathing and Patient connected to nasal cannula oxygen  Post-op Pain: mild  Post-op Assessment: Post-op Vital signs reviewed, Patient's Cardiovascular Status Stable, Respiratory Function Stable, Patent Airway and Pain level controlled  Post-op Vital Signs: stable  Complications: No apparent anesthesia complications

## 2012-05-16 NOTE — H&P (Signed)
Gregg Pierce is an 52 y.o. male.   Chief Complaint: Right comminuted clavicle fracture HPI: Patient is a 52 year old gentleman who fell sustaining a comminuted right clavicle fracture.  Past Medical History  Diagnosis Date  . Hypertension     sees Dr. Evert Pierce, [place] family  . Kidney stone     hx of  . GERD (gastroesophageal reflux disease)     hx of    Past Surgical History  Procedure Laterality Date  . Orif tibia plateau  01/31/2011    Procedure: OPEN REDUCTION INTERNAL FIXATION (ORIF) TIBIAL PLATEAU;  Surgeon: Gregg Mustard, MD;  Location: [facility];  Service: Orthopedics;  Laterality: Right;  Open Reduction Internal Fixation right tibial plateau  . Back surgery      2000  . Fracture surgery      tibia right    History reviewed. No pertinent family history. Social History:  reports that he has quit smoking. His smoking use included Cigarettes. He smoked 0.00 packs per day. He does not have any smokeless tobacco history on file. He reports that he does not use illicit drugs. His alcohol history is not on file.  Allergies: No Known Allergies  Medications Prior to Admission  Medication Sig Dispense Refill  . lisinopril (PRINIVIL,ZESTRIL) 5 MG tablet Take 5 mg by mouth daily.      Marland Kitchen oxyCODONE-acetaminophen (PERCOCET/ROXICET) 5-325 MG per tablet Take 1 tablet by mouth every 4 (four) hours as needed for pain.        Results for orders placed during the hospital encounter of 05/16/12 (from the past 48 hour(s))  APTT     Status: None   Collection Time    05/16/12  7:06 AM      Result Value Range   aPTT 28  24 - 37 seconds  CBC     Status: None   Collection Time    05/16/12  7:06 AM      Result Value Range   WBC 9.3  4.0 - 10.5 K/uL   RBC 5.03  4.22 - 5.81 MIL/uL   Hemoglobin 15.3  13.0 - 17.0 g/dL   HCT 95.6  21.3 - 08.6 %   MCV 84.5  78.0 - 100.0 fL   MCH 30.4  26.0 - 34.0 pg   MCHC 36.0  30.0 - 36.0 g/dL   RDW 57.8  46.9 - 62.9 %   Platelets 213  150 - 400 K/uL   PROTIME-INR     Status: None   Collection Time    05/16/12  7:06 AM      Result Value Range   Prothrombin Time 13.3  11.6 - 15.2 seconds   INR 1.02  0.00 - 1.49   No results found.  Review of Systems  All other systems reviewed and are negative.    Blood pressure 135/91, pulse 83, temperature 98.3 F (36.8 C), temperature source Oral, resp. rate 20, height 6' (1.829 m), weight 112.492 kg (248 lb), SpO2 96.00%. Physical Exam  On examination patient's right upper extremity is neurovascularly intact. There is shortening of the clavicle with comminution. Assessment/Plan Assessment: Comminuted right clavicle fracture with shortening.  Plan: Discussed patient options including nonoperative versus operative intervention patient states he like to proceed with surgery. Risks and benefits were discussed including infection neurovascular injury nonhealing of the wound nonhealing of the bone need for additional surgery. Patient states he understands and wished to proceed at this time.  Gregg Pierce V 05/16/2012, 8:07 AM

## 2012-05-16 NOTE — Anesthesia Preprocedure Evaluation (Addendum)
Anesthesia Evaluation  Patient identified by MRN, date of birth, ID band Patient awake    Reviewed: Allergy & Precautions, H&P , NPO status , Patient's Chart, lab work & pertinent test results  History of Anesthesia Complications Negative for: history of anesthetic complications  Airway Mallampati: I TM Distance: >3 FB Neck ROM: Full    Dental  (+) Edentulous Upper   Pulmonary  breath sounds clear to auscultation        Cardiovascular hypertension, Pt. on medications Rhythm:Regular Rate:Normal     Neuro/Psych    GI/Hepatic GERD-  ,  Endo/Other    Renal/GU      Musculoskeletal   Abdominal   Peds  Hematology   Anesthesia Other Findings   Reproductive/Obstetrics                          Anesthesia Physical Anesthesia Plan  ASA: II  Anesthesia Plan: General   Post-op Pain Management:    Induction: Intravenous  Airway Management Planned: Oral ETT  Additional Equipment:   Intra-op Plan:   Post-operative Plan: Extubation in OR  Informed Consent: I have reviewed the patients History and Physical, chart, labs and discussed the procedure including the risks, benefits and alternatives for the proposed anesthesia with the patient or authorized representative who has indicated his/her understanding and acceptance.   Dental advisory given  Plan Discussed with: Anesthesiologist, Surgeon and CRNA  Anesthesia Plan Comments: (L. Clavicle Fx. Htn  Plan GA with oral ETT  Kipp Brood, MD)       Anesthesia Quick Evaluation

## 2012-05-16 NOTE — Progress Notes (Signed)
Call to Dr. Noreene Larsson, reported pt. Having sprite, approx.- 4ozs. This a.m. At 0600

## 2012-05-16 NOTE — Op Note (Signed)
OPERATIVE REPORT  DATE OF SURGERY: 05/16/2012  PATIENT:  Gregg Pierce,  52 y.o. male  PRE-OPERATIVE DIAGNOSIS:  Right Clavicle Fracture  POST-OPERATIVE DIAGNOSIS:  Right Clavicle Fracture  PROCEDURE:  Procedure(s): Open Reduction Internal Fixation Right Clavicle Fracture  SURGEON:  Surgeon(s): Nadara Mustard, MD  ANESTHESIA:   local and general  EBL:  min ML  SPECIMEN:  No Specimen  TOURNIQUET:  * No tourniquets in log *  PROCEDURE DETAILS: Patient is a 52 year old gentleman who fell sustaining a comminuted and shortened right clavicle fracture. Patient wished to proceed with surgical intervention. Risks and benefits were discussed patient states he understands and wishes to proceed at this time. Description of procedure patient was brought to the operating room and underwent a general anesthetic. After adequate levels of anesthesia were obtained patient was placed in the beachchair position and the right upper extremity was prepped using DuraPrep draped into a sterile field Ioban was used to cover all exposed skin. An incision was made just the anterior and inferior to the clavicle. This dissection was carried down to the fracture site. The fracture was edges were freshened the fracture was reduced and held reduced with a Acumed plate. 3 compression screws were placed proximally and distally. The clavicle was held out to length there was one segmental piece that was not disturbed the circulation to the pes was not disturbed. The wound was irrigated with normal saline. C-arm fluoroscopy verified reduction. The incision was closed using 2-0 Vicryl. Steri-Strips were applied Mepilex dressing was applied patient was placed in a sling extubated taken to the PACU in stable condition.  PLAN OF CARE: Discharge to home after PACU  PATIENT DISPOSITION:  PACU - hemodynamically stable.   Nadara Mustard, MD 05/16/2012 9:32 AM

## 2012-05-16 NOTE — Transfer of Care (Signed)
Immediate Anesthesia Transfer of Care Note  Patient: Gregg Pierce  Procedure(s) Performed: Procedure(s) with comments: Open Reduction Internal Fixation Right Clavicle Fracture (Right) - Open Reduction Internal Fixation Right Clavicle Fracture  Patient Location: PACU  Anesthesia Type:General  Level of Consciousness: awake, alert  and oriented  Airway & Oxygen Therapy: Patient Spontanous Breathing and Patient connected to nasal cannula oxygen  Post-op Assessment: Report given to PACU RN and Post -op Vital signs reviewed and stable  Post vital signs: Reviewed and stable  Complications: No apparent anesthesia complications

## 2012-05-17 ENCOUNTER — Encounter (HOSPITAL_COMMUNITY): Payer: Self-pay | Admitting: Orthopedic Surgery

## 2012-10-27 IMAGING — CR DG ABDOMEN 1V
1 series · 1 of 1 positions shown · non-contrast
Comparison: 09/16/2010

CLINICAL DATA: Pre lithotripsy, left renal stone.

ABDOMEN - 1 VIEW

[t abdomen supine *]
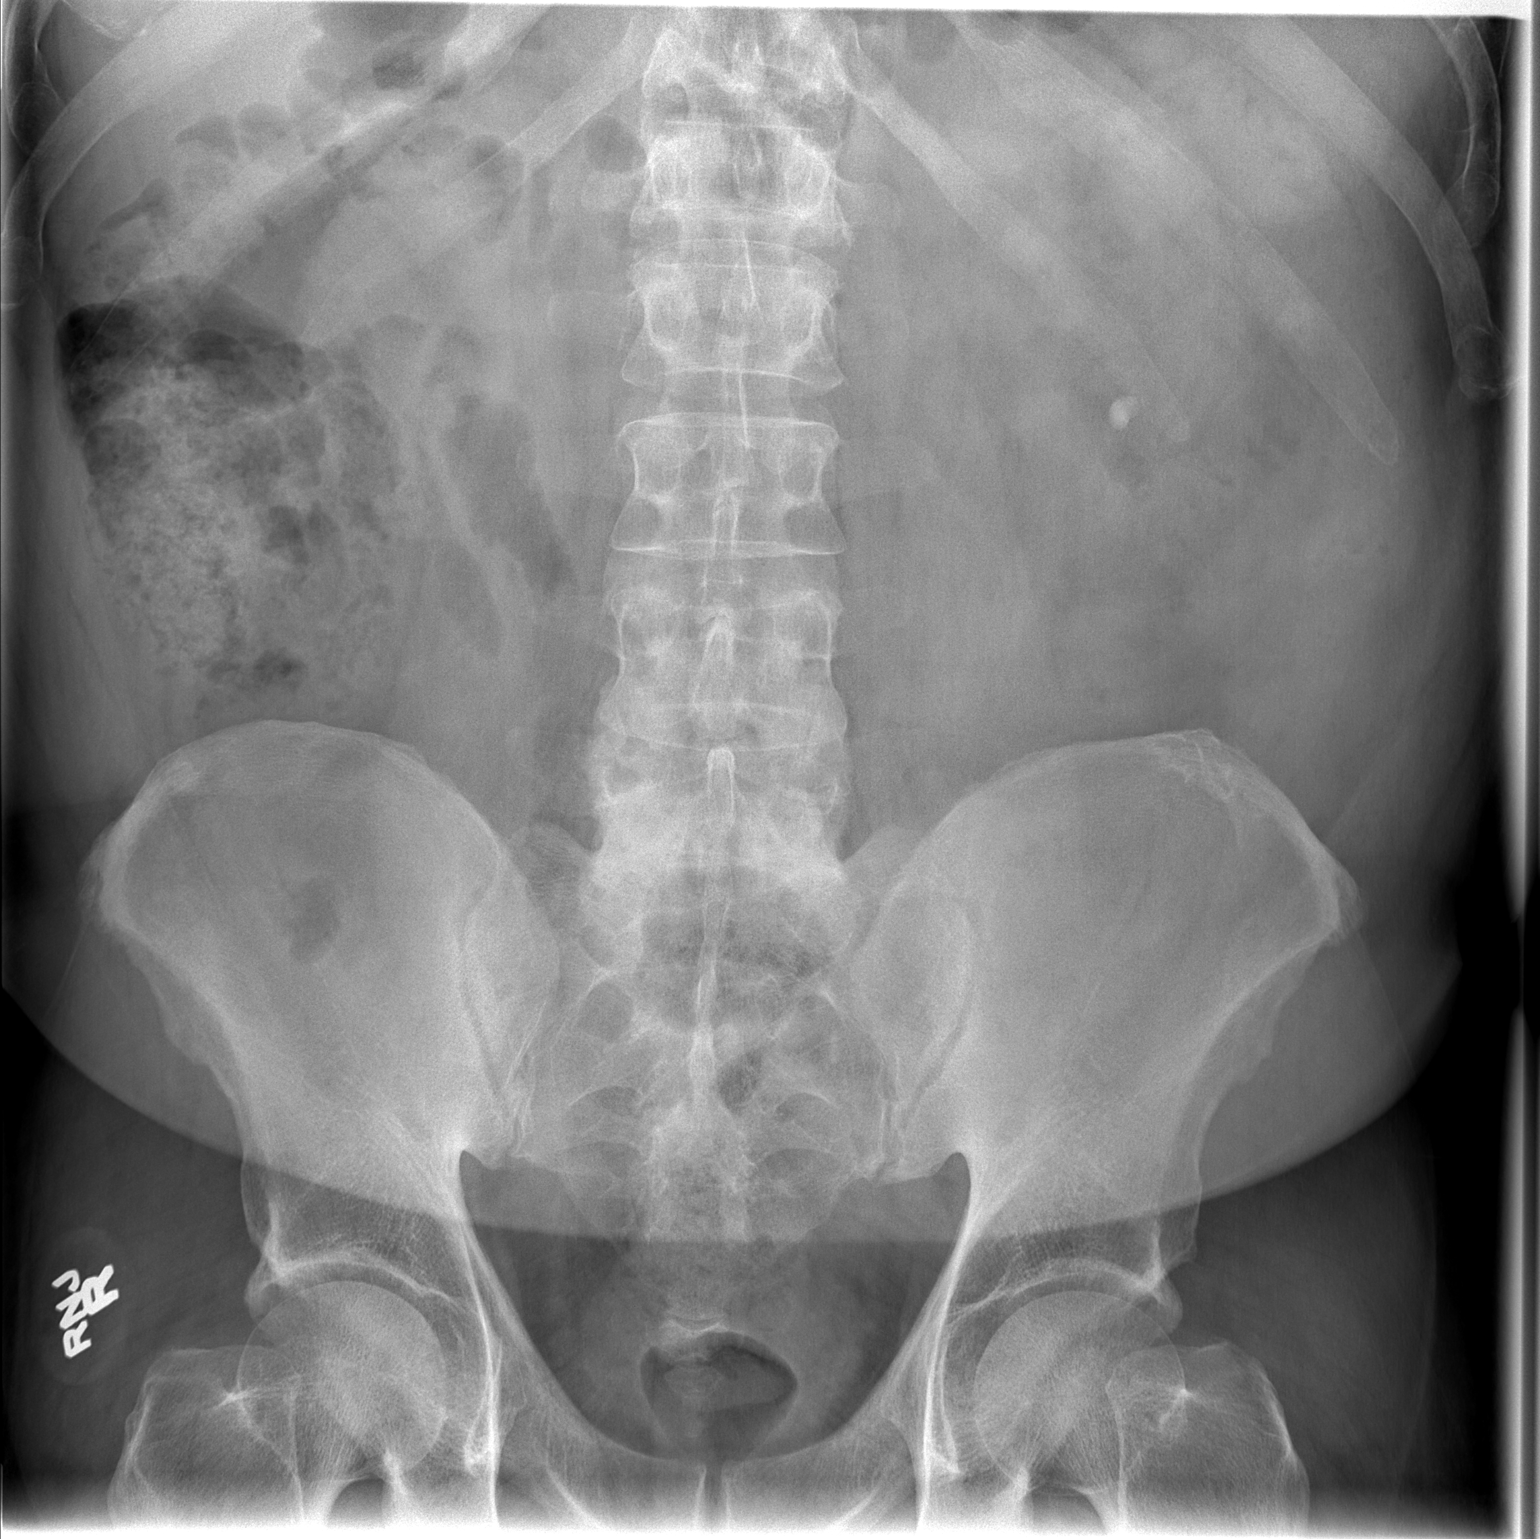

[1 of 1 positions shown; findings below may reference images not displayed]

FINDINGS: 9 mm left renal stone projects over the lower pole.
Nonobstructive bowel gas pattern.  The hemidiaphragms are excluded
from the image.  No osseous abnormality identified.
IMPRESSION: 9 mm stone projecting over the lower pole left renal shadow.
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
## 2013-02-02 IMAGING — RF DG KNEE 1-2V*R*
1 series · 2 of 2 positions shown · non-contrast
Comparison: 01/30/2011

CLINICAL DATA: ORIF right tibial plateau

RIGHT KNEE - 1-2 VIEW

[Series 1: run · 2 of 2 slices shown]
[im 1/2]
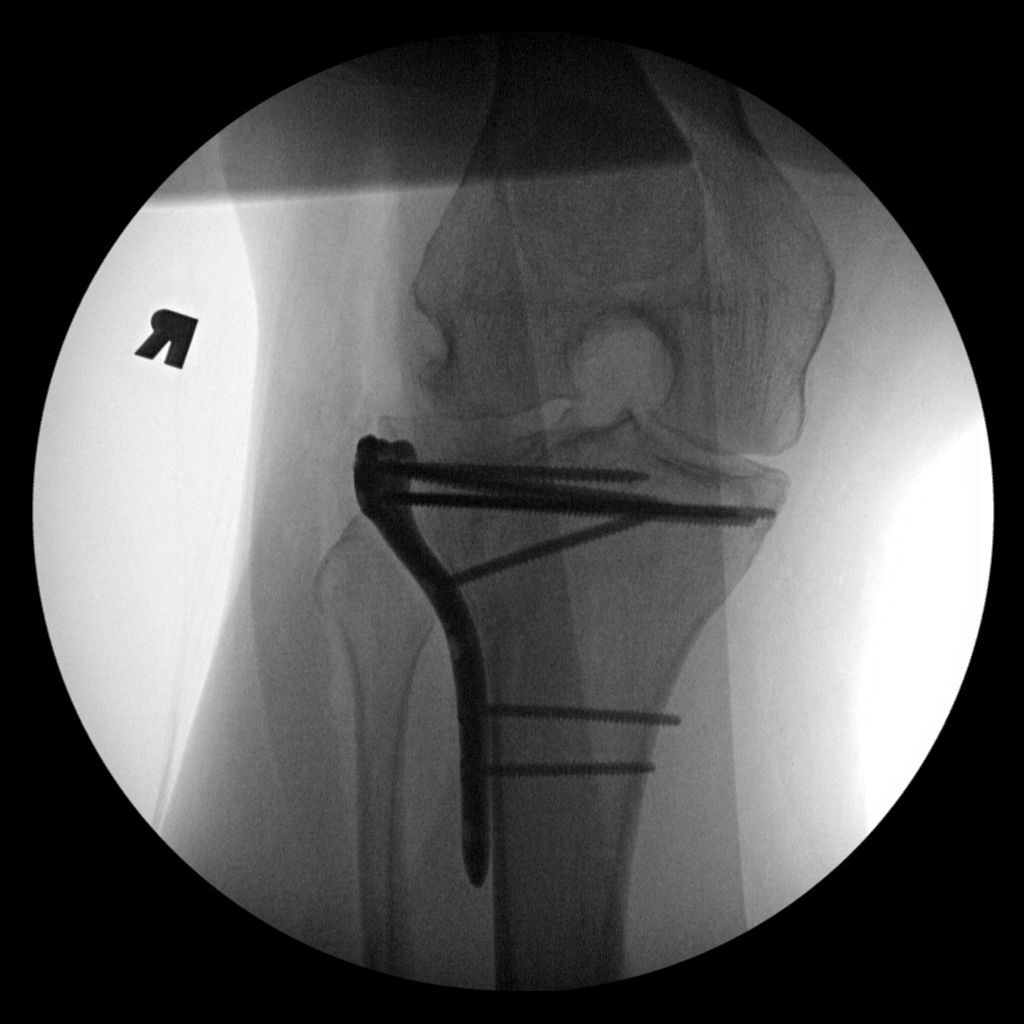
[im 2/2]
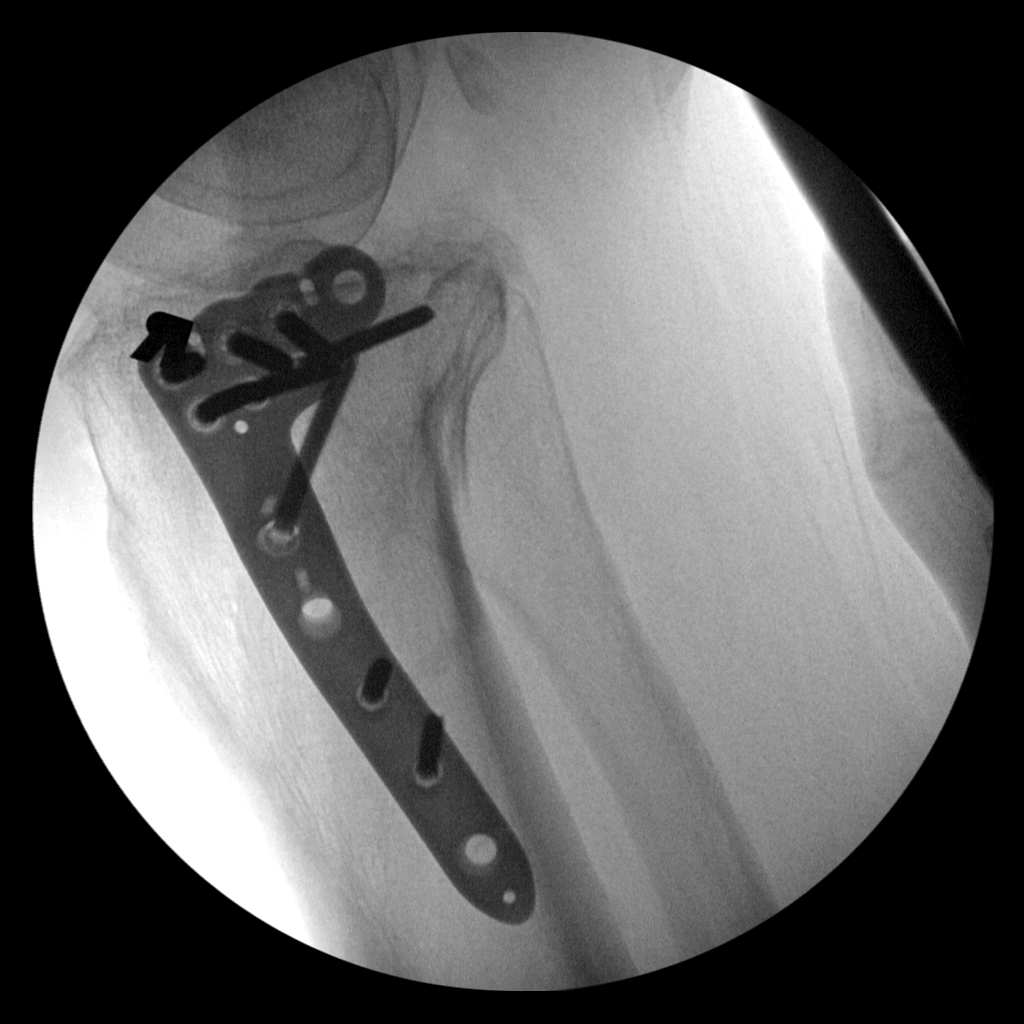

[2 of 2 positions shown; findings below may reference images not displayed]

FINDINGS: Lateral plate and multiple screws placed at lateral aspect of
proximal right tibia post ORIF of lateral plateau fracture.
Elevation of depressed fracture fragments is seen.
No acute complication identified.
Diffuse osseous demineralization.
Underlying osteoarthritic changes.
IMPRESSION: Post ORIF of right lateral tibial plateau fracture.

## 2013-09-25 DIAGNOSIS — IMO0002 Reserved for concepts with insufficient information to code with codable children: Secondary | ICD-10-CM | POA: Insufficient documentation

## 2013-09-25 DIAGNOSIS — M1710 Unilateral primary osteoarthritis, unspecified knee: Secondary | ICD-10-CM | POA: Insufficient documentation

## 2013-09-25 DIAGNOSIS — M179 Osteoarthritis of knee, unspecified: Secondary | ICD-10-CM | POA: Insufficient documentation

## 2015-07-08 ENCOUNTER — Encounter: Payer: Self-pay | Admitting: Allergy and Immunology

## 2015-07-08 ENCOUNTER — Ambulatory Visit (INDEPENDENT_AMBULATORY_CARE_PROVIDER_SITE_OTHER): Payer: Commercial Managed Care - PPO | Admitting: Allergy and Immunology

## 2015-07-08 VITALS — BP 142/100 | HR 60 | Temp 98.4°F | Resp 20 | Ht 70.87 in | Wt 258.8 lb

## 2015-07-08 DIAGNOSIS — L299 Pruritus, unspecified: Secondary | ICD-10-CM

## 2015-07-08 DIAGNOSIS — H1010 Acute atopic conjunctivitis, unspecified eye: Secondary | ICD-10-CM

## 2015-07-08 DIAGNOSIS — J309 Allergic rhinitis, unspecified: Secondary | ICD-10-CM | POA: Diagnosis not present
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
## 2015-07-08 NOTE — Patient Instructions (Addendum)
  1. Allergen avoidance measures  2. Treat and prevent inflammation:   A. OTC Rhinocort - one spray each nostril 3-7 times per week. Coupon.  3. If needed:   A. OTC antihistamine - Claritin/Zyrtec 10 mg one time per day  4. Review all previous blood tests past 6 months. Further testing?  5. Consider consolidating caffeine and chocolate consumption  6. Further evaluation?  7. Return to clinic in 4 weeks or earlier if problem

## 2015-08-05 ENCOUNTER — Ambulatory Visit (INDEPENDENT_AMBULATORY_CARE_PROVIDER_SITE_OTHER): Payer: Commercial Managed Care - PPO | Admitting: Allergy and Immunology

## 2015-08-05 ENCOUNTER — Encounter: Payer: Self-pay | Admitting: Allergy and Immunology

## 2015-08-05 VITALS — BP 158/98 | HR 80 | Resp 18

## 2015-08-05 DIAGNOSIS — L299 Pruritus, unspecified: Secondary | ICD-10-CM | POA: Diagnosis not present

## 2015-08-05 DIAGNOSIS — L500 Allergic urticaria: Secondary | ICD-10-CM | POA: Diagnosis not present
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
## 2015-08-05 NOTE — Patient Instructions (Signed)
  1. Continue to perform Allergen avoidance measures  2. Continue to Treat and prevent inflammation:   A. OTC Rhinocort - one spray each nostril 3-7 times per week. Coupon.  3. If needed:   A. OTC Zyrtec 10 mg one time per day. Can increase to twice a day  4. Blood - CBC with differential, CMP, sed, TSH, T4, TP, UA  5. Continue to consolidate caffeine and chocolate consumption  6. Return to clinic in 6 months or earlier if problem

## 2015-12-10 ENCOUNTER — Other Ambulatory Visit: Payer: Self-pay | Admitting: Urology

## 2015-12-21 ENCOUNTER — Encounter (HOSPITAL_COMMUNITY): Payer: Self-pay

## 2015-12-24 ENCOUNTER — Ambulatory Visit (HOSPITAL_COMMUNITY)
Admission: RE | Admit: 2015-12-24 | Discharge: 2015-12-24 | Disposition: A | Discharge status: Home / Self Care | Payer: Commercial Managed Care - PPO | Source: Ambulatory Visit | Attending: Urology | Admitting: Urology

## 2015-12-24 ENCOUNTER — Ambulatory Visit (HOSPITAL_COMMUNITY): Payer: Commercial Managed Care - PPO

## 2015-12-24 ENCOUNTER — Encounter (HOSPITAL_COMMUNITY): Payer: Self-pay | Admitting: General Practice

## 2015-12-24 ENCOUNTER — Encounter (HOSPITAL_COMMUNITY)
Admission: RE | Disposition: A | Discharge status: Home / Self Care | Payer: Self-pay | Source: Ambulatory Visit | Attending: Urology

## 2015-12-24 DIAGNOSIS — I10 Essential (primary) hypertension: Secondary | ICD-10-CM | POA: Insufficient documentation

## 2015-12-24 DIAGNOSIS — Z6836 Body mass index (BMI) 36.0-36.9, adult: Secondary | ICD-10-CM | POA: Diagnosis not present

## 2015-12-24 DIAGNOSIS — N200 Calculus of kidney: Secondary | ICD-10-CM | POA: Insufficient documentation

## 2015-12-24 DIAGNOSIS — E669 Obesity, unspecified: Secondary | ICD-10-CM | POA: Insufficient documentation

## 2015-12-24 DIAGNOSIS — N201 Calculus of ureter: Secondary | ICD-10-CM

## 2015-12-24 DIAGNOSIS — R319 Hematuria, unspecified: Secondary | ICD-10-CM | POA: Diagnosis present

## 2015-12-24 HISTORY — DX: Personal history of urinary calculi: Z87.442

## 2015-12-24 SURGERY — LITHOTRIPSY, ESWL
Anesthesia: LOCAL | Laterality: Left

## 2015-12-24 MED ORDER — DIAZEPAM 5 MG PO TABS
10.0000 mg | ORAL_TABLET | ORAL | Status: AC
  Administered 2015-12-24: 10 mg via ORAL
  Filled 2015-12-24: qty 2

## 2015-12-24 MED ORDER — DIPHENHYDRAMINE HCL 25 MG PO CAPS
25.0000 mg | ORAL_CAPSULE | ORAL | Status: AC
  Administered 2015-12-24: 25 mg via ORAL
  Filled 2015-12-24: qty 1

## 2015-12-24 MED ORDER — CIPROFLOXACIN HCL 500 MG PO TABS
500.0000 mg | ORAL_TABLET | ORAL | Status: AC
  Administered 2015-12-24: 500 mg via ORAL
  Filled 2015-12-24: qty 1

## 2015-12-24 MED ORDER — SODIUM CHLORIDE 0.9 % IV SOLN
INTRAVENOUS | Status: DC
  Administered 2015-12-24: 07:00:00 via INTRAVENOUS

## 2015-12-24 NOTE — Interval H&P Note (Signed)
History and Physical Interval Note:  12/24/2015 9:43 AM  Gregg LagerJoseph W Ek  has presented today for surgery, with the diagnosis of LEFT RENAL CALCULUS  The various methods of treatment have been discussed with the patient and family. After consideration of risks, benefits and other options for treatment, the patient has consented to  Procedure(s): LEFT EXTRACORPOREAL SHOCK WAVE LITHOTRIPSY (ESWL) (Left) as a surgical intervention .  The patient's history has been reviewed, patient examined, no change in status, stable for surgery.  I have reviewed the patient's chart and labs.  Questions were answered to the patient's satisfaction.     Vendela Troung I Delano Scardino

## 2015-12-24 NOTE — H&P (Signed)
Office Visit Report     12/07/2015   --------------------------------------------------------------------------------   Gregg Pierce  MRN: [id number]  PRIMARY CARE:  Deatra James  DOB: Dec 16, 1960, 55 year old Male  REFERRING:  Deatra James  [id number]  PROVIDER:  Bjorn Pippin, M.D.    TREATING:  Anne Fu    LOCATION:  [facility] [phone]   --------------------------------------------------------------------------------   CC/HPI: Pt has history of hematuria as well as known left sided renal calculi. It was noted the stone had moved positions from lower to possibly upper pole in February of this year. Hematuria workup was recommended but the patient declined.     CC: I have blood in my urine.  HPI: Gregg Pierce is a 55 year-old male established patient who is here for blood in the urine.  Painless gross hematuria that began last week. No associated LUTS, fevers, or renal colic. He denies passage of any stone material. Last CT was 2015. He last underwent ESWL in Jul 26, 2010.    He did see the blood in his urine. He first noticed the symptoms approximately 11/30/2015. He has not seen blood clots.   He does not have a burning sensation when he urinates. He is not currently having trouble urinating.   He is not having pain.   His last U/S or CT Scan was 09/09/2013.     ALLERGIES: No Allergies    MEDICATIONS: Amlodipine Besylate  Testosterone Supplement     GU PSH: Renal ESWL - 07-26-10      PSH Notes: Closed Treatment Of Clavicular Fracture, Lithotripsy, Elective Circumcision, Back Surgery   NON-GU PSH: Treat Clavicle Fracture - 04/17/2015    GU PMH: Gross hematuria, Gross hematuria - 04/17/2015 Kidney Stone, Kidney stone on left side - 04/17/2015 Other microscopic hematuria, Microscopic hematuria - 04/17/2015 Calculus Ureter, Distal Ureteral Stone On The Left - 25-Jul-2012, Proximal Ureteral Stone On The Left, - 2014    NON-GU PMH: Encounter for general adult medical  examination without abnormal findings, Encounter for preventive health examination - 04/17/2015 Personal history of (healed) traumatic fracture, History of fracture of clavicle - 04/17/2015 Personal history of other diseases of the circulatory system, History of hypertension - 07/25/2012 Personal history of other diseases of the digestive system, History of esophageal reflux - 25-Jul-2012    FAMILY HISTORY: Death In The Family Father - Runs In Family Death In The Family Mother - Runs In Family Family Health Status Number - Runs In Family No pertinent family history - Other   SOCIAL HISTORY: No Social History     Notes: Never a smoker, Alcohol Use, Occupation:, Marital History - Currently Married, Tobacco Use, Caffeine Use   REVIEW OF SYSTEMS:    GU Review Male:   Patient denies frequent urination, hard to postpone urination, burning/ pain with urination, get up at night to urinate, leakage of urine, stream starts and stops, trouble starting your stream, have to strain to urinate , erection problems, and penile pain.  Gastrointestinal (Upper):   Patient denies nausea, vomiting, and indigestion/ heartburn.  Gastrointestinal (Lower):   Patient denies diarrhea and constipation.  Constitutional:   Patient denies fever, night sweats, weight loss, and fatigue.  Skin:   Patient denies skin rash/ lesion and itching.  Eyes:   Patient denies blurred vision and double vision.  Ears/ Nose/ Throat:   Patient denies sore throat and sinus problems.  Hematologic/Lymphatic:   Patient denies swollen glands and easy bruising.  Cardiovascular:   Patient denies leg swelling  and chest pains.  Respiratory:   Patient denies cough and shortness of breath.  Endocrine:   Patient denies excessive thirst.  Musculoskeletal:   Patient denies back pain and joint pain.  Neurological:   Patient denies headaches and dizziness.  Psychologic:   Patient denies depression and anxiety.   VITAL SIGNS:      12/07/2015 09:03 AM  Weight 260 lb  / 117.93 kg  Height 72 in / 182.88 cm  BP 142/92 mmHg  Pulse 73 /min  Temperature 97.2 F / 36 C  BMI 35.3 kg/m   MULTI-SYSTEM PHYSICAL EXAMINATION:    Constitutional: Well-nourished. No physical deformities. Normally developed. Good grooming.  Respiratory: No labored breathing, no use of accessory muscles. CTA.  Cardiovascular: Normal temperature, normal extremity pulses, no swelling, no varicosities. S1/S2 auscultated. RRR.  Skin: No paleness, no jaundice, no cyanosis. No lesion, no ulcer, no rash.  Neurologic / Psychiatric: Oriented to time, oriented to place, oriented to person. No depression, no anxiety, no agitation.  Gastrointestinal: No mass, no tenderness, no rigidity, non obese abdomen.  Musculoskeletal: Spine, ribs, pelvis no bilateral tenderness. Normal gait and station of head and neck.     PAST DATA REVIEWED:  Source Of History:  Patient  Records Review:   Previous Patient Records  Urine Test Review:   Urinalysis  X-Ray Review: KUB: Reviewed Films.     12/07/15  Urinalysis  Urine Appearance Clear   Urine Specimen Voided   Urine Color Yellow   Urine Glucose Neg   Urine Bilirubin Neg   Urine Ketones Neg   Urine Specific Gravity 1.020   Urine Blood 3+   Urine pH 5.5   Urine Protein Trace   Urine Urobilinogen 0.2   Urine Nitrites Neg   Urine Leukocyte Esterase Neg   Urine WBC/hpf NS (Not Seen)   Urine RBC/hpf 20-40/hpf   Urine Epithelial Cells 0-5/hpf   Urine Bacteria Few (10-25/hpf)   Urine Mucous Not Present   Urine Yeast NS (Not Seen)   Urine Trichomonas Not Present   Urine Cystals Amorph Urates   Urine Casts NS (Not Seen)   Urine Sperm Not Present    PROCEDURES:         KUB - 74000  A single view of the abdomen is obtained.      1.2-1.3 cm calculus seen in the mid-upper pole of left kidney. No other renal or ureteral calculi seen. Bladder appears clear. No other obvious boney abnormalities seen.         Urinalysis w/Scope - 81001 Dipstick  Dipstick Cont'd Micro  Specimen: Voided Bilirubin: Neg WBC/hpf: NS (Not Seen)  Color: Yellow Ketones: Neg RBC/hpf: 20-40/hpf  Appearance: Clear Blood: 3+ Bacteria: Few (10-25/hpf)  Specific Gravity: 1.020 Protein: Trace Cystals: Amorph Urates  pH: 5.5 Urobilinogen: 0.2 Casts: NS (Not Seen)  Glucose: Neg Nitrites: Neg Trichomonas: Not Present    Leukocyte Esterase: Neg Mucous: Not Present      Epithelial Cells: 0-5/hpf      Yeast: NS (Not Seen)      Sperm: Not Present    ASSESSMENT:      ICD-10 Details  1 GU:   Gross hematuria - R31.0   2   Kidney Stone - N20.0    PLAN:           Orders Labs Urine Culture and Sensitivity  X-Rays: KUB  X-Ray Notes: ...          Schedule Return Visit: Other See Visit Notes - Follow up  MD, Schedule Surgery          Document Letter(s):  Created for Patient: Clinical Summary         Notes:   Ultimately CT imaging would provide more insight into patient's intermittent hematuria. He only wished to undergo XRAY today. Source of hematuria likely from large calculus in left kidney. I discussed the options for managing none obstructing kidney stones. He understands that his stone is likely to increase in size and that over time this may cause obstructing to part of his kidney resulting in decreased renal function. I went over conservative management consisting of surveillance with serial imaging with KUB and renal ultrasound to assess for interval growth. We also discussed more aggressive interventions including Shockwave lithotripsy and ureteroscopy. I briefly explained to him what the different treatment modalities consisted of, and what he might expect with each treatment. I'll discuss with Dr Annabell HowellsWrenn how to proceed including patient's now desire for treatment. Analgesia offered but he declined.    Signed by Anne FuLarry Gibson on 12/07/15 at 9:59 AM (EDT)     The information contained in this medical record document is considered private and confidential  patient information. This information can only be used for the medical diagnosis and/or medical services that are being provided by the patient's selected caregivers. This information can only be distributed outside of the patient's care if the patient agrees and signs waivers of authorization for this information to be sent to an outside source or route.

## 2015-12-24 NOTE — Discharge Instructions (Signed)
Kidney Stones °Kidney stones (urolithiasis) are deposits that form inside your kidneys. The intense pain is caused by the stone moving through the urinary tract. When the stone moves, the ureter goes into spasm around the stone. The stone is usually passed in the urine.  °CAUSES  °· A disorder that makes certain neck glands produce too much parathyroid hormone (primary hyperparathyroidism). °· A buildup of uric acid crystals, similar to gout in your joints. °· Narrowing (stricture) of the ureter. °· A kidney obstruction present at birth (congenital obstruction). °· Previous surgery on the kidney or ureters. °· Numerous kidney infections. °SYMPTOMS  °· Feeling sick to your stomach (nauseous). °· Throwing up (vomiting). °· Blood in the urine (hematuria). °· Pain that usually spreads (radiates) to the groin. °· Frequency or urgency of urination. °DIAGNOSIS  °· Taking a history and physical exam. °· Blood or urine tests. °· CT scan. °· Occasionally, an examination of the inside of the urinary bladder (cystoscopy) is performed. °TREATMENT  °· Observation. °· Increasing your fluid intake. °· Extracorporeal shock wave lithotripsy--This is a noninvasive procedure that uses shock waves to break up kidney stones. °· Surgery may be needed if you have severe pain or persistent obstruction. There are various surgical procedures. Most of the procedures are performed with the use of small instruments. Only small incisions are needed to accommodate these instruments, so recovery time is minimized. °The size, location, and chemical composition are all important variables that will determine the proper choice of action for you. Talk to your health care provider to better understand your situation so that you will minimize the risk of injury to yourself and your kidney.  °HOME CARE INSTRUCTIONS  °· Drink enough water and fluids to keep your urine clear or pale yellow. This will help you to pass the stone or stone fragments. °· Strain  all urine through the provided strainer. Keep all particulate matter and stones for your health care provider to see. The stone causing the pain may be as small as a grain of salt. It is very important to use the strainer each and every time you pass your urine. The collection of your stone will allow your health care provider to analyze it and verify that a stone has actually passed. The stone analysis will often identify what you can do to reduce the incidence of recurrences. °· Only take over-the-counter or prescription medicines for pain, discomfort, or fever as directed by your health care provider. °· Keep all follow-up visits as told by your health care provider. This is important. °· Get follow-up X-rays if required. The absence of pain does not always mean that the stone has passed. It may have only stopped moving. If the urine remains completely obstructed, it can cause loss of kidney function or even complete destruction of the kidney. It is your responsibility to make sure X-rays and follow-ups are completed. Ultrasounds of the kidney can show blockages and the status of the kidney. Ultrasounds are not associated with any radiation and can be performed easily in a matter of minutes. °· Make changes to your daily diet as told by your health care provider. You may be told to: °¨ Limit the amount of salt that you eat. °¨ Eat 5 or more servings of fruits and vegetables each day. °¨ Limit the amount of meat, poultry, fish, and eggs that you eat. °· Collect a 24-hour urine sample as told by your health care provider. You may need to collect another urine sample every 6-12   months. °SEEK MEDICAL CARE IF: °· You experience pain that is progressive and unresponsive to any pain medicine you have been prescribed. °SEEK IMMEDIATE MEDICAL CARE IF:  °· Pain cannot be controlled with the prescribed medicine. °· You have a fever or shaking chills. °· The severity or intensity of pain increases over 18 hours and is not  relieved by pain medicine. °· You develop a new onset of abdominal pain. °· You feel faint or pass out. °· You are unable to urinate. °  °This information is not intended to replace advice given to you by your health care provider. Make sure you discuss any questions you have with your health care provider. °  °Document Released: 02/14/2005 Document Revised: 11/05/2014 Document Reviewed: 07/18/2012 °Elsevier Interactive Patient Education ©2016 [employer] ° °

## 2016-02-16 ENCOUNTER — Ambulatory Visit: Payer: Commercial Managed Care - PPO | Admitting: Allergy and Immunology

## 2016-03-09 DIAGNOSIS — X32XXXD Exposure to sunlight, subsequent encounter: Secondary | ICD-10-CM | POA: Diagnosis not present

## 2016-03-09 DIAGNOSIS — L570 Actinic keratosis: Secondary | ICD-10-CM | POA: Diagnosis not present

## 2016-03-09 DIAGNOSIS — L218 Other seborrheic dermatitis: Secondary | ICD-10-CM | POA: Diagnosis not present

## 2016-03-09 DIAGNOSIS — L718 Other rosacea: Secondary | ICD-10-CM | POA: Diagnosis not present

## 2016-03-21 ENCOUNTER — Telehealth: Payer: Self-pay | Admitting: Allergy and Immunology

## 2016-03-21 ENCOUNTER — Telehealth: Payer: Self-pay | Admitting: *Deleted

## 2016-03-21 NOTE — Telephone Encounter (Signed)
Pt called and said that he has not heard about his bloodwork . Would someone call him [phone]

## 2016-03-21 NOTE — Telephone Encounter (Signed)
Adjusted.

## 2016-03-21 NOTE — Telephone Encounter (Signed)
Pt states that he received a bill for a no show fee. Pt states he never scheduled an appointment. Would like a call to fix things

## 2016-03-22 NOTE — Telephone Encounter (Signed)
Spoke with patient and advised that we did any current labs. The ones from 07-2015 were not done, and he said okay probably not. He is going to just go see his pcp for yearly CPE and blood work.   Also states that he has a 25.00 no-show fee that was charged to his account for a missed appointment on 02/16/2016. He states that he did not know about that appt. And he did call [facility] office and was told to call here. Contact info given to office manager.

## 2016-03-22 NOTE — Telephone Encounter (Signed)
Left message for patient to call back. No lab results in his chart.

## 2016-05-05 DIAGNOSIS — M7732 Calcaneal spur, left foot: Secondary | ICD-10-CM | POA: Diagnosis not present

## 2016-05-05 DIAGNOSIS — M7662 Achilles tendinitis, left leg: Secondary | ICD-10-CM | POA: Diagnosis not present

## 2016-10-05 DIAGNOSIS — M7732 Calcaneal spur, left foot: Secondary | ICD-10-CM | POA: Diagnosis not present

## 2017-08-28 DIAGNOSIS — R42 Dizziness and giddiness: Secondary | ICD-10-CM | POA: Diagnosis not present

## 2017-08-28 DIAGNOSIS — L309 Dermatitis, unspecified: Secondary | ICD-10-CM | POA: Diagnosis not present

## 2017-08-28 DIAGNOSIS — I10 Essential (primary) hypertension: Secondary | ICD-10-CM | POA: Diagnosis not present

## 2017-10-05 DIAGNOSIS — L700 Acne vulgaris: Secondary | ICD-10-CM | POA: Diagnosis not present

## 2017-10-13 DIAGNOSIS — L258 Unspecified contact dermatitis due to other agents: Secondary | ICD-10-CM | POA: Diagnosis not present

## 2019-12-14 ENCOUNTER — Other Ambulatory Visit: Payer: Self-pay

## 2019-12-14 ENCOUNTER — Emergency Department (HOSPITAL_COMMUNITY): Payer: Commercial Managed Care - PPO

## 2019-12-14 ENCOUNTER — Encounter (HOSPITAL_COMMUNITY): Payer: Self-pay

## 2019-12-14 ENCOUNTER — Emergency Department (HOSPITAL_COMMUNITY)
Admission: EM | Admit: 2019-12-14 | Discharge: 2019-12-14 | Disposition: A | Discharge status: Home / Self Care | Payer: Commercial Managed Care - PPO | Attending: Emergency Medicine | Admitting: Emergency Medicine

## 2019-12-14 DIAGNOSIS — Z79899 Other long term (current) drug therapy: Secondary | ICD-10-CM | POA: Insufficient documentation

## 2019-12-14 DIAGNOSIS — N200 Calculus of kidney: Secondary | ICD-10-CM | POA: Insufficient documentation

## 2019-12-14 DIAGNOSIS — R1032 Left lower quadrant pain: Secondary | ICD-10-CM | POA: Diagnosis present

## 2019-12-14 DIAGNOSIS — K219 Gastro-esophageal reflux disease without esophagitis: Secondary | ICD-10-CM | POA: Insufficient documentation

## 2019-12-14 DIAGNOSIS — I10 Essential (primary) hypertension: Secondary | ICD-10-CM | POA: Diagnosis not present

## 2019-12-14 LAB — URINALYSIS, ROUTINE W REFLEX MICROSCOPIC
Bacteria, UA: NONE SEEN
Bilirubin Urine: NEGATIVE
Glucose, UA: NEGATIVE mg/dL
Ketones, ur: NEGATIVE mg/dL
Leukocytes,Ua: NEGATIVE
Nitrite: NEGATIVE
Protein, ur: NEGATIVE mg/dL
Specific Gravity, Urine: 1.019 (ref 1.005–1.030)
pH: 5 (ref 5.0–8.0)

## 2019-12-14 LAB — CBC WITH DIFFERENTIAL/PLATELET
Abs Immature Granulocytes: 0.07 10*3/uL (ref 0.00–0.07)
Basophils Absolute: 0 10*3/uL (ref 0.0–0.1)
Basophils Relative: 0 %
Eosinophils Absolute: 0.1 10*3/uL (ref 0.0–0.5)
Eosinophils Relative: 1 %
HCT: 47 % (ref 39.0–52.0)
Hemoglobin: 16.3 g/dL (ref 13.0–17.0)
Immature Granulocytes: 1 %
Lymphocytes Relative: 18 %
Lymphs Abs: 1.8 10*3/uL (ref 0.7–4.0)
MCH: 29.9 pg (ref 26.0–34.0)
MCHC: 34.7 g/dL (ref 30.0–36.0)
MCV: 86.2 fL (ref 80.0–100.0)
Monocytes Absolute: 0.6 10*3/uL (ref 0.1–1.0)
Monocytes Relative: 6 %
Neutro Abs: 7.4 10*3/uL (ref 1.7–7.7)
Neutrophils Relative %: 74 %
Platelets: 236 10*3/uL (ref 150–400)
RBC: 5.45 MIL/uL (ref 4.22–5.81)
RDW: 12.1 % (ref 11.5–15.5)
WBC: 10 10*3/uL (ref 4.0–10.5)
nRBC: 0 % (ref 0.0–0.2)

## 2019-12-14 LAB — BASIC METABOLIC PANEL
Anion gap: 11 (ref 5–15)
BUN: 18 mg/dL (ref 6–20)
CO2: 25 mmol/L (ref 22–32)
Calcium: 9.1 mg/dL (ref 8.9–10.3)
Chloride: 104 mmol/L (ref 98–111)
Creatinine, Ser: 0.93 mg/dL (ref 0.61–1.24)
GFR, Estimated: >60 mL/min (ref >60)
Glucose, Bld: 139 mg/dL — ABNORMAL HIGH (ref 70–99)
Potassium: 4.2 mmol/L (ref 3.5–5.1)
Sodium: 140 mmol/L (ref 135–145)

## 2019-12-14 MED ORDER — HYDROMORPHONE HCL 1 MG/ML IJ SOLN
1.0000 mg | Freq: Once | INTRAMUSCULAR | Status: AC
  Administered 2019-12-14: 1 mg via INTRAVENOUS
  Filled 2019-12-14: qty 1

## 2019-12-14 MED ORDER — OXYCODONE-ACETAMINOPHEN 5-325 MG PO TABS
1.0000 | ORAL_TABLET | Freq: Once | ORAL | Status: AC
  Administered 2019-12-14: 1 via ORAL
  Filled 2019-12-14: qty 1

## 2019-12-14 MED ORDER — ONDANSETRON HCL 4 MG/2ML IJ SOLN
4.0000 mg | Freq: Once | INTRAMUSCULAR | Status: AC
  Administered 2019-12-14: 4 mg via INTRAVENOUS
  Filled 2019-12-14: qty 2

## 2019-12-14 MED ORDER — KETOROLAC TROMETHAMINE 15 MG/ML IJ SOLN
15.0000 mg | Freq: Once | INTRAMUSCULAR | Status: AC
  Administered 2019-12-14: 15 mg via INTRAVENOUS
  Filled 2019-12-14: qty 1

## 2019-12-14 MED ORDER — OXYCODONE-ACETAMINOPHEN 5-325 MG PO TABS: 1.0000 | ORAL_TABLET | Freq: Four times a day (QID) | ORAL | 0 refills | Status: DC | PRN

## 2019-12-14 MED ORDER — HYDROMORPHONE HCL 1 MG/ML IJ SOLN
0.5000 mg | Freq: Once | INTRAMUSCULAR | Status: AC
  Administered 2019-12-14: 0.5 mg via INTRAVENOUS
  Filled 2019-12-14 (×2): qty 1

## 2019-12-14 MED ORDER — HYDROMORPHONE HCL 1 MG/ML IJ SOLN
0.5000 mg | Freq: Once | INTRAMUSCULAR | Status: AC
  Administered 2019-12-14: 0.5 mg via INTRAVENOUS
  Filled 2019-12-14: qty 1

## 2019-12-14 MED ORDER — TAMSULOSIN HCL 0.4 MG PO CAPS: 0.4000 mg | ORAL_CAPSULE | Freq: Every day | ORAL | 0 refills | Status: DC

## 2019-12-14 NOTE — ED Notes (Signed)
Pt complaining of left side abdominal pain. Pt tolerated IV meds well.

## 2019-12-14 NOTE — Discharge Instructions (Signed)
In addition to the pain pill you can also take ibuprofen to help with the pain.  If you develop fever, worsening pain or vomiting please return to the ER.  Otherwise plan on following up with urology for ongoing care.

## 2019-12-14 NOTE — ED Notes (Signed)
Pt is in X-RAY °
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
## 2019-12-14 NOTE — ED Triage Notes (Signed)
paitent c/o LLQ abdominal pain since 0400 today. Patient reports a history of kidney stones

## 2019-12-17 ENCOUNTER — Other Ambulatory Visit: Payer: Self-pay | Admitting: Urology

## 2019-12-18 ENCOUNTER — Encounter (HOSPITAL_COMMUNITY): Payer: Self-pay | Admitting: Urology

## 2019-12-18 ENCOUNTER — Inpatient Hospital Stay (HOSPITAL_COMMUNITY): Admission: RE | Admit: 2019-12-18 | Payer: Commercial Managed Care - PPO | Source: Ambulatory Visit

## 2019-12-18 ENCOUNTER — Other Ambulatory Visit (HOSPITAL_COMMUNITY)
Admission: RE | Admit: 2019-12-18 | Discharge: 2019-12-18 | Disposition: A | Discharge status: Home / Self Care | Payer: Commercial Managed Care - PPO | Source: Ambulatory Visit | Attending: Urology | Admitting: Urology

## 2019-12-18 ENCOUNTER — Other Ambulatory Visit: Payer: Self-pay

## 2019-12-18 DIAGNOSIS — Z01818 Encounter for other preprocedural examination: Secondary | ICD-10-CM | POA: Insufficient documentation

## 2019-12-18 DIAGNOSIS — Z20822 Contact with and (suspected) exposure to covid-19: Secondary | ICD-10-CM | POA: Diagnosis not present

## 2019-12-18 LAB — SARS CORONAVIRUS 2 (TAT 6-24 HRS): SARS Coronavirus 2: NEGATIVE

## 2019-12-18 NOTE — Progress Notes (Signed)
COVID Vaccine Completed: 06/24/19 Date COVID Vaccine completed: COVID vaccine manufacturer: Pfizer    Moderna   [employer]   PCP - Deatra James, MD Cardiologist -   Chest x-ray -  EKG -  Stress Test -  ECHO -  Cardiac Cath -  Pacemaker/ICD device last checked:  Sleep Study -  CPAP -   Fasting Blood Sugar -  Checks Blood Sugar _____ times a day  Blood Thinner Instructions: Aspirin Instructions: Last Dose:  Anesthesia review:   Patient denies shortness of breath, fever, cough and chest pain at PAT appointment   Patient verbalized understanding of instructions that were given to them at the PAT appointment. Patient was also instructed that they will need to review over the PAT instructions again at home before surgery.

## 2019-12-18 NOTE — Progress Notes (Signed)
Called patients home and spoke with his wife. Patient is to arrive at 1030 for surgery on 12/19/19 instead of 0930. She verbalizes understanding.

## 2019-12-19 ENCOUNTER — Encounter (HOSPITAL_COMMUNITY): Payer: Self-pay | Admitting: Urology

## 2019-12-19 ENCOUNTER — Encounter (HOSPITAL_COMMUNITY)
Admission: RE | Disposition: A | Discharge status: Home / Self Care | Payer: Self-pay | Source: Home / Self Care | Attending: Urology

## 2019-12-19 ENCOUNTER — Ambulatory Visit (HOSPITAL_COMMUNITY): Payer: Commercial Managed Care - PPO | Admitting: Certified Registered"

## 2019-12-19 ENCOUNTER — Ambulatory Visit (HOSPITAL_COMMUNITY)
Admission: RE | Admit: 2019-12-19 | Discharge: 2019-12-19 | Disposition: A | Discharge status: Home / Self Care | Payer: Commercial Managed Care - PPO | Attending: Urology | Admitting: Urology

## 2019-12-19 ENCOUNTER — Ambulatory Visit (HOSPITAL_COMMUNITY): Payer: Commercial Managed Care - PPO

## 2019-12-19 DIAGNOSIS — Z79890 Hormone replacement therapy (postmenopausal): Secondary | ICD-10-CM | POA: Insufficient documentation

## 2019-12-19 DIAGNOSIS — N200 Calculus of kidney: Secondary | ICD-10-CM

## 2019-12-19 DIAGNOSIS — I10 Essential (primary) hypertension: Secondary | ICD-10-CM | POA: Diagnosis not present

## 2019-12-19 DIAGNOSIS — E669 Obesity, unspecified: Secondary | ICD-10-CM | POA: Insufficient documentation

## 2019-12-19 DIAGNOSIS — Z87891 Personal history of nicotine dependence: Secondary | ICD-10-CM | POA: Diagnosis not present

## 2019-12-19 DIAGNOSIS — N132 Hydronephrosis with renal and ureteral calculous obstruction: Secondary | ICD-10-CM | POA: Insufficient documentation

## 2019-12-19 DIAGNOSIS — Z6835 Body mass index (BMI) 35.0-35.9, adult: Secondary | ICD-10-CM | POA: Diagnosis not present

## 2019-12-19 HISTORY — PX: CYSTOSCOPY WITH STENT PLACEMENT: SHX5790

## 2019-12-19 SURGERY — CYSTOSCOPY, WITH STENT INSERTION
Anesthesia: General | Laterality: Left

## 2019-12-19 MED ORDER — OXYCODONE HCL 5 MG PO TABS: 5.0000 mg | ORAL_TABLET | Freq: Once | ORAL | Status: DC | PRN

## 2019-12-19 MED ORDER — ONDANSETRON HCL 4 MG/2ML IJ SOLN
INTRAMUSCULAR | Status: DC | PRN
  Administered 2019-12-19: 4 mg via INTRAVENOUS

## 2019-12-19 MED ORDER — DEXAMETHASONE SODIUM PHOSPHATE 10 MG/ML IJ SOLN
INTRAMUSCULAR | Status: DC | PRN
  Administered 2019-12-19: 10 mg via INTRAVENOUS

## 2019-12-19 MED ORDER — FENTANYL CITRATE (PF) 100 MCG/2ML IJ SOLN
INTRAMUSCULAR | Status: AC
  Filled 2019-12-19: qty 2

## 2019-12-19 MED ORDER — LIDOCAINE 2% (20 MG/ML) 5 ML SYRINGE
INTRAMUSCULAR | Status: AC
  Filled 2019-12-19: qty 5

## 2019-12-19 MED ORDER — CHLORHEXIDINE GLUCONATE 0.12 % MT SOLN
15.0000 mL | Freq: Once | OROMUCOSAL | Status: AC
  Administered 2019-12-19: 15 mL via OROMUCOSAL

## 2019-12-19 MED ORDER — ONDANSETRON HCL 4 MG/2ML IJ SOLN
INTRAMUSCULAR | Status: AC
  Filled 2019-12-19: qty 2

## 2019-12-19 MED ORDER — DEXAMETHASONE SODIUM PHOSPHATE 10 MG/ML IJ SOLN
INTRAMUSCULAR | Status: AC
  Filled 2019-12-19: qty 1

## 2019-12-19 MED ORDER — IOHEXOL 300 MG/ML  SOLN
INTRAMUSCULAR | Status: DC | PRN
  Administered 2019-12-19: 8 mL

## 2019-12-19 MED ORDER — ORAL CARE MOUTH RINSE: 15.0000 mL | Freq: Once | OROMUCOSAL | Status: AC

## 2019-12-19 MED ORDER — DEXTROSE 5 % IV SOLN
3.0000 g | Freq: Once | INTRAVENOUS | Status: AC
  Administered 2019-12-19: 3 g via INTRAVENOUS
  Filled 2019-12-19: qty 3

## 2019-12-19 MED ORDER — EPHEDRINE 5 MG/ML INJ
INTRAVENOUS | Status: AC
  Filled 2019-12-19: qty 10

## 2019-12-19 MED ORDER — PROPOFOL 10 MG/ML IV BOLUS
INTRAVENOUS | Status: DC | PRN
  Administered 2019-12-19: 200 mg via INTRAVENOUS
  Administered 2019-12-19: 50 mg via INTRAVENOUS

## 2019-12-19 MED ORDER — FENTANYL CITRATE (PF) 100 MCG/2ML IJ SOLN
INTRAMUSCULAR | Status: DC | PRN
  Administered 2019-12-19 (×2): 50 ug via INTRAVENOUS

## 2019-12-19 MED ORDER — SODIUM CHLORIDE 0.9 % IR SOLN
Status: DC | PRN
  Administered 2019-12-19: 3000 mL via INTRAVESICAL

## 2019-12-19 MED ORDER — LACTATED RINGERS IV SOLN: INTRAVENOUS | Status: DC

## 2019-12-19 MED ORDER — PHENYLEPHRINE 40 MCG/ML (10ML) SYRINGE FOR IV PUSH (FOR BLOOD PRESSURE SUPPORT)
PREFILLED_SYRINGE | INTRAVENOUS | Status: DC | PRN
  Administered 2019-12-19: 80 ug via INTRAVENOUS
  Administered 2019-12-19: 120 ug via INTRAVENOUS
  Administered 2019-12-19: 80 ug via INTRAVENOUS

## 2019-12-19 MED ORDER — PHENYLEPHRINE 40 MCG/ML (10ML) SYRINGE FOR IV PUSH (FOR BLOOD PRESSURE SUPPORT)
PREFILLED_SYRINGE | INTRAVENOUS | Status: AC
  Filled 2019-12-19: qty 10

## 2019-12-19 MED ORDER — ONDANSETRON HCL 4 MG/2ML IJ SOLN: 4.0000 mg | Freq: Once | INTRAMUSCULAR | Status: DC | PRN

## 2019-12-19 MED ORDER — TAMSULOSIN HCL 0.4 MG PO CAPS: 0.4000 mg | ORAL_CAPSULE | Freq: Every day | ORAL | 3 refills | Status: AC

## 2019-12-19 MED ORDER — KETOROLAC TROMETHAMINE 30 MG/ML IJ SOLN
INTRAMUSCULAR | Status: DC | PRN
  Administered 2019-12-19: 30 mg via INTRAVENOUS

## 2019-12-19 MED ORDER — LIDOCAINE 2% (20 MG/ML) 5 ML SYRINGE
INTRAMUSCULAR | Status: DC | PRN
  Administered 2019-12-19: 60 mg via INTRAVENOUS

## 2019-12-19 MED ORDER — KETOROLAC TROMETHAMINE 30 MG/ML IJ SOLN
INTRAMUSCULAR | Status: AC
  Filled 2019-12-19: qty 1

## 2019-12-19 MED ORDER — HYDROMORPHONE HCL 1 MG/ML IJ SOLN: 0.2500 mg | INTRAMUSCULAR | Status: DC | PRN

## 2019-12-19 MED ORDER — MIDAZOLAM HCL 5 MG/5ML IJ SOLN
INTRAMUSCULAR | Status: DC | PRN
  Administered 2019-12-19: 2 mg via INTRAVENOUS

## 2019-12-19 MED ORDER — PROPOFOL 10 MG/ML IV BOLUS
INTRAVENOUS | Status: AC
  Filled 2019-12-19: qty 40

## 2019-12-19 MED ORDER — MIDAZOLAM HCL 2 MG/2ML IJ SOLN
INTRAMUSCULAR | Status: AC
  Filled 2019-12-19: qty 2

## 2019-12-19 MED ORDER — OXYCODONE HCL 5 MG/5ML PO SOLN: 5.0000 mg | Freq: Once | ORAL | Status: DC | PRN

## 2019-12-19 SURGICAL SUPPLY — 14 items
BAG URO CATCHER STRL LF (MISCELLANEOUS) ×2 IMPLANT
CATH INTERMIT  6FR 70CM (CATHETERS) ×2 IMPLANT
CATH URET 5FR 28IN OPEN ENDED (CATHETERS) ×2 IMPLANT
CLOTH BEACON ORANGE TIMEOUT ST (SAFETY) ×2 IMPLANT
GLOVE BIOGEL M 7.0 STRL (GLOVE) ×2 IMPLANT
GOWN STRL REUS W/TWL LRG LVL3 (GOWN DISPOSABLE) ×4 IMPLANT
GUIDEWIRE STR DUAL SENSOR (WIRE) ×2 IMPLANT
GUIDEWIRE ZIPWRE .038 STRAIGHT (WIRE) IMPLANT
KIT TURNOVER KIT A (KITS) IMPLANT
MANIFOLD NEPTUNE II (INSTRUMENTS) ×2 IMPLANT
PACK CYSTO (CUSTOM PROCEDURE TRAY) ×2 IMPLANT
STENT URET 6FRX28 CONTOUR (STENTS) ×1 IMPLANT
TUBING CONNECTING 10 (TUBING) ×2 IMPLANT
TUBING UROLOGY SET (TUBING) IMPLANT

## 2019-12-19 NOTE — Op Note (Signed)
Operative Note  Preoperative diagnosis:  1.  L UPJ and renal stones 2. Left flank pain  Postoperative diagnosis: 1.  L UPJ and renal stones 2. Left flank pain  Procedure(s): 1.  Cystoscopy 2. Left retrograde pyelogram with interpretation 3. Left ureteral stent placement  Surgeon: Jettie Pagan, MD  Assistants:  None  Anesthesia:  General  Complications:  None  EBL:  minimal  Specimens: 1. none  Drains/Catheters: 1.  6Fr x 28cm left ureteral stent  Intraoperative findings:   1. Left UPJ stone measuring 58mm and left sided stone burden >2 cm 2. Successful placement of left ureteral stent with curl in renal pelvis and bladder  Indication:  Gregg Pierce is a 59 y.o. male with left UPJ stone measuring 11 mm as well as total left renal stone burden greater than 2 cm.  He had no sign of infection however he had intractable left flank pain refractory to medical management.  We will plan to perform a left-sided PCNL in the near future however due to his persistent pain, being brought back the operating room today for left ureteral stent placement.  After reviewing the management options for treatment, he elected to proceed with the above surgical procedure(s). We have discussed the potential benefits and risks of the procedure, side effects of the proposed treatment, the likelihood of the patient achieving the goals of the procedure, and any potential problems that might occur during the procedure or recuperation. Informed consent has been obtained.  Description of procedure: The patient was taken to the operating room and general anesthesia was induced.  The patient was placed in the dorsal lithotomy position, prepped and draped in the usual sterile fashion, and preoperative antibiotics were administered. A preoperative time-out was performed.   Cystourethroscopy was performed.  The patient's urethra was examined and was normal he had some mild prostatic hypertrophy. The bladder was  then systematically examined in its entirety. There was no evidence for any bladder tumors, stones, or other mucosal pathology.    Attention then turned to the left ureteral orifice and a ureteral catheter was used to intubate the ureteral orifice.  Omnipaque contrast was injected through the ureteral catheter and a retrograde pyelogram was performed with findings as dictated above.  He had moderate left-sided hydroureteronephrosis.  There is a filling defect in the left UPJ corresponding to the 11 mm stone divided on preoperative CT scan.  He also had other filling defects corresponding to left-sided renal stones in the left lower pole.  A 0.38 sensor guidewire was then advanced up the left was given ureter into the renal pelvis under fluoroscopic guidance.  The wire was then backloaded through the cystoscope and a ureteral stent was advance over the wire using Seldinger technique.  The stent was positioned appropriately under fluoroscopic and cystoscopic guidance.  The wire was then removed with an adequate stent curl noted in the renal pelvis as well as in the bladder.  The bladder was then emptied and the procedure ended.  The patient appeared to tolerate the procedure well and without complications.  The patient was able to be awakened and transferred to the recovery unit in satisfactory condition.   Plan: Patient may discharge home following recovery in PACU.  He will follow-up for left PCNL that will be scheduled in the coming weeks.  Gregg R. Makenzie Vittorio MD [facility]  Pager: [phone]

## 2019-12-19 NOTE — Discharge Instructions (Signed)
   Activity:  You are encouraged to ambulate frequently (about every hour during waking hours) to help prevent blood clots from forming in your legs or lungs.  However, you should not engage in any heavy lifting (> 10-15 lbs), strenuous activity, or straining.   Diet: You should advance your diet as instructed by your physician.  It will be normal to have some bloating, nausea, and abdominal discomfort intermittently.   Prescriptions:  You will be provided a prescription for pain medication to take as needed.  If your pain is not severe enough to require the prescription pain medication, you may take extra strength Tylenol instead which will have less side effects.  You should also take a prescribed stool softener to avoid straining with bowel movements as the prescription pain medication may constipate you.   What to call us about: You should call the office [phone]) if you develop fever > 101 or develop persistent vomiting. Activity:  You are encouraged to ambulate frequently (about every hour during waking hours) to help prevent blood clots from forming in your legs or lungs.  However, you should not engage in any heavy lifting (> 10-15 lbs), strenuous activity, or straining.  You have a left stent draining your kidney. This is temporary and will be removed at upcoming procedure.

## 2019-12-19 NOTE — Transfer of Care (Signed)
Immediate Anesthesia Transfer of Care Note  Patient: Gregg Pierce  Procedure(s) Performed: CYSTOSCOPY WITH STENT PLACEMENT;RETROGRADE PYELOGRAM LEFT (Left )  Patient Location: PACU  Anesthesia Type:General  Level of Consciousness: awake, oriented, patient cooperative and responds to stimulation  Airway & Oxygen Therapy: Patient Spontanous Breathing and Patient connected to face mask oxygen  Post-op Assessment: Report given to RN and Post -op Vital signs reviewed and stable  Post vital signs: Reviewed and stable  Last Vitals:  Vitals Value Taken Time  BP 138/92 12/19/19 1250  Temp    Pulse 111 12/19/19 1252  Resp 17 12/19/19 1252  SpO2 99 % 12/19/19 1252  Vitals shown include unvalidated device data.  Last Pain:  Vitals:   12/19/19 1041  TempSrc: Oral  PainSc: 7       Patients Stated Pain Goal: 6 (12/19/19 1041)  Complications: No complications documented.

## 2019-12-19 NOTE — Anesthesia Preprocedure Evaluation (Addendum)
Anesthesia Evaluation  Patient identified by MRN, date of birth, ID band Patient awake    Reviewed: Allergy & Precautions, NPO status , Patient's Chart, lab work & pertinent test results  Airway Mallampati: I  TM Distance: >3 FB Neck ROM: Full    Dental  (+) Caps, Upper Dentures   Pulmonary former smoker,    Pulmonary exam normal breath sounds clear to auscultation       Cardiovascular hypertension, Normal cardiovascular exam Rhythm:Regular Rate:Normal     Neuro/Psych negative neurological ROS  negative psych ROS   GI/Hepatic Neg liver ROS, GERD  ,  Endo/Other  Obesity Hyperlipidemia  Renal/GU Renal diseaseLeft neprolithiasis  negative genitourinary   Musculoskeletal  (+) Arthritis , Osteoarthritis,  Herniated disc L5-S1   Abdominal (+) + obese,   Peds  Hematology negative hematology ROS (+)   Anesthesia Other Findings   Reproductive/Obstetrics                            Anesthesia Physical Anesthesia Plan  ASA: II  Anesthesia Plan: General   Post-op Pain Management:    Induction: Intravenous  PONV Risk Score and Plan: 4 or greater and Ondansetron, Dexamethasone, Midazolam and Treatment may vary due to age or medical condition  Airway Management Planned: LMA  Additional Equipment:   Intra-op Plan:   Post-operative Plan: Extubation in OR  Informed Consent: I have reviewed the patients History and Physical, chart, labs and discussed the procedure including the risks, benefits and alternatives for the proposed anesthesia with the patient or authorized representative who has indicated his/her understanding and acceptance.     Dental advisory given  Plan Discussed with: CRNA and Anesthesiologist  Anesthesia Plan Comments:         Anesthesia Quick Evaluation
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
## 2019-12-19 NOTE — Anesthesia Postprocedure Evaluation (Signed)
Anesthesia Post Note  Patient: Gregg Pierce  Procedure(s) Performed: CYSTOSCOPY WITH STENT PLACEMENT;RETROGRADE PYELOGRAM LEFT (Left )     Patient location during evaluation: PACU Anesthesia Type: General Level of consciousness: awake and alert and oriented Pain management: pain level controlled Vital Signs Assessment: post-procedure vital signs reviewed and stable Respiratory status: spontaneous breathing, nonlabored ventilation and respiratory function stable Cardiovascular status: blood pressure returned to baseline and stable Postop Assessment: no apparent nausea or vomiting Anesthetic complications: no   No complications documented.  Last Vitals:  Vitals:   12/19/19 1250 12/19/19 1300  BP: (!) 138/92 (!) 150/90  Pulse: (!) 108 (!) 104  Resp: 19 16  Temp: 36.6 C   SpO2: 97% 100%    Last Pain:  Vitals:   12/19/19 1300  TempSrc:   PainSc: 0-No pain                 Miyuki Rzasa A.

## 2019-12-19 NOTE — Anesthesia Procedure Notes (Signed)
Procedure Name: LMA Insertion Date/Time: 12/19/2019 12:20 PM Performed by: Jorene Minors, CRNA Pre-anesthesia Checklist: Patient identified, Emergency Drugs available, Suction available and Patient being monitored Patient Re-evaluated:Patient Re-evaluated prior to induction Oxygen Delivery Method: Circle system utilized Preoxygenation: Pre-oxygenation with 100% oxygen Induction Type: IV induction LMA: LMA with gastric port inserted LMA Size: 5.0 Tube type: Oral Number of attempts: 1 Airway Equipment and Method: Oral airway Placement Confirmation: positive ETCO2 and breath sounds checked- equal and bilateral Tube secured with: Tape Dental Injury: Teeth and Oropharynx as per pre-operative assessment

## 2019-12-20 ENCOUNTER — Encounter (HOSPITAL_COMMUNITY): Payer: Self-pay | Admitting: Urology

## 2019-12-25 ENCOUNTER — Other Ambulatory Visit: Payer: Self-pay | Admitting: Urology

## 2019-12-26 ENCOUNTER — Other Ambulatory Visit (HOSPITAL_COMMUNITY): Payer: Self-pay | Admitting: Urology

## 2019-12-26 DIAGNOSIS — N200 Calculus of kidney: Secondary | ICD-10-CM

## 2020-01-02 NOTE — Progress Notes (Signed)
DUE TO COVID-19 ONLY ONE VISITOR IS ALLOWED TO COME WITH YOU AND STAY IN THE WAITING ROOM ONLY DURING PRE OP AND PROCEDURE DAY OF SURGERY. THE 1 VISITOR  MAY VISIT WITH YOU AFTER SURGERY IN YOUR PRIVATE ROOM DURING VISITING HOURS ONLY!  YOU NEED TO HAVE A COVID 19 TEST ON_______ @_______ , THIS TEST MUST BE DONE BEFORE SURGERY,  COVID TESTING SITE [address] [place] , IT IS ON THE RIGHT GOING OUT [address] APPROXIMATELY  2 MINUTES PAST [employer] ON THE RIGHT. ONCE YOUR COVID TEST IS COMPLETED,  PLEASE BEGIN THE QUARANTINE INSTRUCTIONS AS OUTLINED IN YOUR HANDOUT.                JACCOB CZAPLICKI  01/02/2020   Your procedure is scheduled on:    Report to [facility] Main  Entrance   Report to admitting at AM     Call this number if you have problems the morning of surgery [phone]    Remember: Do not eat food , candy gum or mints :After Midnight. You may have clear liquids from midnight until     CLEAR LIQUID DIET   Foods Allowed                                                                       Coffee and tea, regular and decaf                              Plain Jell-O any favor except red or purple                                            Fruit ices (not with fruit pulp)                                      Iced Popsicles                                     Carbonated beverages, regular and diet                                    Cranberry, grape and apple juices Sports drinks like Gatorade Lightly seasoned clear broth or consume(fat free) Sugar, honey syrup   _____________________________________________________________________    BRUSH YOUR TEETH MORNING OF SURGERY AND RINSE YOUR MOUTH OUT, NO CHEWING GUM CANDY OR MINTS.     Take these medicines the morning of surgery with A SIP OF WATER:   DO NOT TAKE ANY DIABETIC MEDICATIONS DAY OF YOUR SURGERY                               You may not have any metal on your  body including hair pins and  piercings  Do not wear jewelry, make-up, lotions, powders or perfumes, deodorant             Do not wear nail polish on your fingernails.  Do not shave  48 hours prior to surgery.              Men may shave face and neck.   Do not bring valuables to the hospital. [place].  Contacts, dentures or bridgework may not be worn into surgery.  Leave suitcase in the car. After surgery it may be brought to your room.     Patients discharged the day of surgery will not be allowed to drive home. IF YOU ARE HAVING SURGERY AND GOING HOME THE SAME DAY, YOU MUST HAVE AN ADULT TO DRIVE YOU HOME AND BE WITH YOU FOR 24 HOURS. YOU MAY GO HOME BY TAXI OR UBER OR ORTHERWISE, BUT AN ADULT MUST ACCOMPANY YOU HOME AND STAY WITH YOU FOR 24 HOURS.  Name and phone number of your driver:  Special Instructions: N/A              Please read over the following fact sheets you were given: _____________________________________________________________________  [facility] - Preparing for Surgery Before surgery, you can play an important role.  Because skin is not sterile, your skin needs to be as free of germs as possible.  You can reduce the number of germs on your skin by washing with CHG (chlorahexidine gluconate) soap before surgery.  CHG is an antiseptic cleaner which kills germs and bonds with the skin to continue killing germs even after washing. Please DO NOT use if you have an allergy to CHG or antibacterial soaps.  If your skin becomes reddened/irritated stop using the CHG and inform your nurse when you arrive at Short Stay. Do not shave (including legs and underarms) for at least 48 hours prior to the first CHG shower.  You may shave your face/neck. Please follow these instructions carefully:  1.  Shower with CHG Soap the night before surgery and the  morning of Surgery.  2.  If you choose to wash your hair, wash your hair  first as usual with your  normal  shampoo.  3.  After you shampoo, rinse your hair and body thoroughly to remove the  shampoo.                           4.  Use CHG as you would any other liquid soap.  You can apply chg directly  to the skin and wash                       Gently with a scrungie or clean washcloth.  5.  Apply the CHG Soap to your body ONLY FROM THE NECK DOWN.   Do not use on face/ open                           Wound or open sores. Avoid contact with eyes, ears mouth and genitals (private parts).                       Wash face,  Genitals (private parts) with your normal soap.             6.  Wash thoroughly, paying special attention to the area where your surgery  will be performed.  7.  Thoroughly rinse your body with warm water from the neck down.  8.  DO NOT shower/wash with your normal soap after using and rinsing off  the CHG Soap.                9.  Pat yourself dry with a clean towel.            10.  Wear clean pajamas.            11.  Place clean sheets on your bed the night of your first shower and do not  sleep with pets. Day of Surgery : Do not apply any lotions/deodorants the morning of surgery.  Please wear clean clothes to the hospital/surgery center.  FAILURE TO FOLLOW THESE INSTRUCTIONS MAY RESULT IN THE CANCELLATION OF YOUR SURGERY PATIENT SIGNATURE_________________________________  NURSE SIGNATURE__________________________________  ________________________________________________________________________            DUE TO COVID-19 ONLY ONE VISITOR IS ALLOWED TO COME WITH YOU AND STAY IN THE WAITING ROOM ONLY DURING PRE OP AND PROCEDURE DAY OF SURGERY. THE 1 VISITOR  MAY VISIT WITH YOU AFTER SURGERY IN YOUR PRIVATE ROOM DURING VISITING HOURS ONLY!  YOU NEED TO HAVE A COVID 19 TEST ON_11/01/2020 ______ @_______ , THIS TEST MUST BE DONE BEFORE SURGERY,  COVID TESTING SITE [address] [place] [phone], IT IS ON THE RIGHT GOING OUT [address] APPROXIMATELY  2 MINUTES PAST [employer] ON THE RIGHT. ONCE YOUR COVID TEST IS COMPLETED,  PLEASE BEGIN THE QUARANTINE INSTRUCTIONS AS OUTLINED IN YOUR HANDOUT.                Patsy LagerJoseph W Burston  01/02/2020   Your procedure is scheduled on: 01/14/2020    [facility]   Report to admitting at      0800 AM     Call this number if you have problems the morning of surgery [phone]    Remember: Do not eat food , candy gum or mints :After Midnight. You may have clear liquids from midnight until 0630 am     CLEAR LIQUID DIET   Foods Allowed                                                                       Coffee and tea, regular and decaf                              Plain Jell-O any favor except red or purple                                            Fruit ices (not with fruit pulp)                                      Iced Popsicles  Carbonated beverages, regular and diet                                    Cranberry, grape and apple juices Sports drinks like Gatorade Lightly seasoned clear broth or consume(fat free) Sugar, honey syrup   _____________________________________________________________________    BRUSH YOUR TEETH MORNING OF SURGERY AND RINSE YOUR MOUTH OUT, NO CHEWING GUM CANDY OR MINTS.     Take these medicines the morning of surgery with A SIP OF WATER:   flomax                                 You may not have any metal on your body including hair pins and              piercings  Do not wear jewelry, make-up, lotions, powders or perfumes, deodorant             Do not wear nail polish on your fingernails.  Do not shave  48 hours prior to surgery.              Men may shave face and neck.   Do not bring valuables to the hospital. [facility] IS NOT             RESPONSIBLE   FOR VALUABLES.  Contacts, dentures or bridgework may not be worn into surgery.  Leave suitcase in the  car. After surgery it may be brought to your room.     Patients discharged the day of surgery will not be allowed to drive home. IF YOU ARE HAVING SURGERY AND GOING HOME THE SAME DAY, YOU MUST HAVE AN ADULT TO DRIVE YOU HOME AND BE WITH YOU FOR 24 HOURS. YOU MAY GO HOME BY TAXI OR UBER OR ORTHERWISE, BUT AN ADULT MUST ACCOMPANY YOU HOME AND STAY WITH YOU FOR 24 HOURS.  Name and phone number of your driver:  Special Instructions: N/A              Please read over the following fact sheets you were given: _____________________________________________________________________  [facility] - Preparing for Surgery Before surgery, you can play an important role.  Because skin is not sterile, your skin needs to be as free of germs as possible.  You can reduce the number of germs on your skin by washing with CHG (chlorahexidine gluconate) soap before surgery.  CHG is an antiseptic cleaner which kills germs and bonds with the skin to continue killing germs even after washing. Please DO NOT use if you have an allergy to CHG or antibacterial soaps.  If your skin becomes reddened/irritated stop using the CHG and inform your nurse when you arrive at Short Stay. Do not shave (including legs and underarms) for at least 48 hours prior to the first CHG shower.  You may shave your face/neck. Please follow these instructions carefully:  1.  Shower with CHG Soap the night before surgery and the  morning of Surgery.  2.  If you choose to wash your hair, wash your hair first as usual with your  normal  shampoo.  3.  After you shampoo, rinse your hair and body thoroughly to remove the  shampoo.                           4.  Use CHG as you would any other liquid soap.  You can apply chg directly  to the skin and wash                       Gently with a scrungie or clean washcloth.  5.  Apply the CHG Soap to your body ONLY FROM THE NECK DOWN.   Do not use on face/ open                           Wound or open sores. Avoid  contact with eyes, ears mouth and genitals (private parts).                       Wash face,  Genitals (private parts) with your normal soap.             6.  Wash thoroughly, paying special attention to the area where your surgery  will be performed.  7.  Thoroughly rinse your body with warm water from the neck down.  8.  DO NOT shower/wash with your normal soap after using and rinsing off  the CHG Soap.                9.  Pat yourself dry with a clean towel.            10.  Wear clean pajamas.            11.  Place clean sheets on your bed the night of your first shower and do not  sleep with pets. Day of Surgery : Do not apply any lotions/deodorants the morning of surgery.  Please wear clean clothes to the hospital/surgery center.  FAILURE TO FOLLOW THESE INSTRUCTIONS MAY RESULT IN THE CANCELLATION OF YOUR SURGERY PATIENT SIGNATURE_________________________________  NURSE SIGNATURE__________________________________  ________________________________________________________________________

## 2020-01-06 ENCOUNTER — Other Ambulatory Visit: Payer: Self-pay

## 2020-01-06 ENCOUNTER — Encounter (HOSPITAL_COMMUNITY): Payer: Self-pay

## 2020-01-06 ENCOUNTER — Encounter (HOSPITAL_COMMUNITY)
Admission: RE | Admit: 2020-01-06 | Discharge: 2020-01-06 | Disposition: A | Discharge status: Home / Self Care | Payer: Commercial Managed Care - PPO | Source: Ambulatory Visit | Attending: Urology | Admitting: Urology

## 2020-01-06 NOTE — Progress Notes (Signed)
Phone call completed for preop visit per ok of Jessica Zanetto,PAC .  History updated and completed and preop instructions completed.  Labs will be done am of surgery.  Covid test appt scheduled.

## 2020-01-10 ENCOUNTER — Other Ambulatory Visit (HOSPITAL_COMMUNITY)
Admission: RE | Admit: 2020-01-10 | Discharge: 2020-01-10 | Disposition: A | Discharge status: Home / Self Care | Payer: Commercial Managed Care - PPO | Source: Ambulatory Visit | Attending: Urology | Admitting: Urology

## 2020-01-10 DIAGNOSIS — Z20822 Contact with and (suspected) exposure to covid-19: Secondary | ICD-10-CM | POA: Diagnosis not present

## 2020-01-10 DIAGNOSIS — Z01812 Encounter for preprocedural laboratory examination: Secondary | ICD-10-CM | POA: Insufficient documentation

## 2020-01-10 LAB — SARS CORONAVIRUS 2 (TAT 6-24 HRS): SARS Coronavirus 2: NEGATIVE

## 2020-01-13 ENCOUNTER — Other Ambulatory Visit: Payer: Self-pay | Admitting: Student

## 2020-01-14 ENCOUNTER — Ambulatory Visit (HOSPITAL_COMMUNITY): Payer: Commercial Managed Care - PPO

## 2020-01-14 ENCOUNTER — Other Ambulatory Visit: Payer: Self-pay

## 2020-01-14 ENCOUNTER — Encounter (HOSPITAL_COMMUNITY): Payer: Self-pay | Admitting: Urology

## 2020-01-14 ENCOUNTER — Encounter (HOSPITAL_COMMUNITY)
Admission: RE | Disposition: A | Discharge status: Home / Self Care | Payer: Self-pay | Source: Home / Self Care | Attending: Urology

## 2020-01-14 ENCOUNTER — Ambulatory Visit (HOSPITAL_COMMUNITY): Payer: Commercial Managed Care - PPO | Admitting: Certified Registered Nurse Anesthetist

## 2020-01-14 ENCOUNTER — Observation Stay (HOSPITAL_COMMUNITY)
Admission: RE | Admit: 2020-01-14 | Discharge: 2020-01-15 | Disposition: A | Discharge status: Home / Self Care | Payer: Commercial Managed Care - PPO | Attending: Urology | Admitting: Urology

## 2020-01-14 ENCOUNTER — Ambulatory Visit (HOSPITAL_COMMUNITY)
Admission: RE | Admit: 2020-01-14 | Discharge: 2020-01-14 | Disposition: A | Discharge status: Home / Self Care | Payer: Commercial Managed Care - PPO | Source: Ambulatory Visit | Attending: Family Medicine | Admitting: Family Medicine

## 2020-01-14 ENCOUNTER — Ambulatory Visit (HOSPITAL_COMMUNITY)
Admission: RE | Admit: 2020-01-14 | Discharge: 2020-01-14 | Disposition: A | Discharge status: Home / Self Care | Payer: Commercial Managed Care - PPO | Source: Ambulatory Visit | Attending: Urology | Admitting: Urology

## 2020-01-14 DIAGNOSIS — Z87891 Personal history of nicotine dependence: Secondary | ICD-10-CM | POA: Insufficient documentation

## 2020-01-14 DIAGNOSIS — I10 Essential (primary) hypertension: Secondary | ICD-10-CM | POA: Diagnosis not present

## 2020-01-14 DIAGNOSIS — N200 Calculus of kidney: Secondary | ICD-10-CM

## 2020-01-14 DIAGNOSIS — R109 Unspecified abdominal pain: Secondary | ICD-10-CM | POA: Diagnosis present

## 2020-01-14 HISTORY — PX: NEPHROLITHOTOMY: SHX5134

## 2020-01-14 HISTORY — PX: IR URETERAL STENT LEFT NEW ACCESS W/O SEP NEPHROSTOMY CATH: IMG6075

## 2020-01-14 LAB — CBC
HCT: 46.1 % (ref 39.0–52.0)
HCT: 43.1 % (ref 39.0–52.0)
Hemoglobin: 15.8 g/dL (ref 13.0–17.0)
Hemoglobin: 14.6 g/dL (ref 13.0–17.0)
MCH: 29.5 pg (ref 26.0–34.0)
MCH: 29.7 pg (ref 26.0–34.0)
MCHC: 34.3 g/dL (ref 30.0–36.0)
MCHC: 33.9 g/dL (ref 30.0–36.0)
MCV: 86 fL (ref 80.0–100.0)
MCV: 87.6 fL (ref 80.0–100.0)
Platelets: 249 10*3/uL (ref 150–400)
Platelets: 211 10*3/uL (ref 150–400)
RBC: 5.36 MIL/uL (ref 4.22–5.81)
RBC: 4.92 MIL/uL (ref 4.22–5.81)
RDW: 11.9 % (ref 11.5–15.5)
RDW: 11.8 % (ref 11.5–15.5)
WBC: 8.2 10*3/uL (ref 4.0–10.5)
WBC: 8.2 10*3/uL (ref 4.0–10.5)
nRBC: 0 % (ref 0.0–0.2)
nRBC: 0 % (ref 0.0–0.2)

## 2020-01-14 LAB — BASIC METABOLIC PANEL
Anion gap: 10 (ref 5–15)
BUN: 18 mg/dL (ref 6–20)
CO2: 26 mmol/L (ref 22–32)
Calcium: 8.4 mg/dL — ABNORMAL LOW (ref 8.9–10.3)
Chloride: 103 mmol/L (ref 98–111)
Creatinine, Ser: 0.88 mg/dL (ref 0.61–1.24)
GFR, Estimated: >60 mL/min (ref >60)
Glucose, Bld: 135 mg/dL — ABNORMAL HIGH (ref 70–99)
Potassium: 4.2 mmol/L (ref 3.5–5.1)
Sodium: 139 mmol/L (ref 135–145)

## 2020-01-14 LAB — APTT: aPTT: 29 seconds (ref 24–36)

## 2020-01-14 LAB — PROTIME-INR
INR: 1 (ref 0.8–1.2)
Prothrombin Time: 12.8 seconds (ref 11.4–15.2)

## 2020-01-14 SURGERY — NEPHROLITHOTOMY PERCUTANEOUS
Anesthesia: General | Laterality: Left

## 2020-01-14 MED ORDER — CEFAZOLIN SODIUM-DEXTROSE 2-4 GM/100ML-% IV SOLN
INTRAVENOUS | Status: AC
  Administered 2020-01-14: 2000 mg
  Filled 2020-01-14: qty 100

## 2020-01-14 MED ORDER — POLYETHYLENE GLYCOL 3350 17 G PO PACK: 17.0000 g | PACK | Freq: Every day | ORAL | Status: DC | PRN

## 2020-01-14 MED ORDER — EPHEDRINE SULFATE-NACL 50-0.9 MG/10ML-% IV SOSY
PREFILLED_SYRINGE | INTRAVENOUS | Status: DC | PRN
  Administered 2020-01-14: 10 mg via INTRAVENOUS

## 2020-01-14 MED ORDER — FENTANYL CITRATE (PF) 100 MCG/2ML IJ SOLN
INTRAMUSCULAR | Status: AC
  Filled 2020-01-14: qty 4

## 2020-01-14 MED ORDER — LIDOCAINE HCL 1 % IJ SOLN
INTRAMUSCULAR | Status: AC
  Filled 2020-01-14: qty 20

## 2020-01-14 MED ORDER — BELLADONNA ALKALOIDS-OPIUM 16.2-60 MG RE SUPP: 1.0000 | Freq: Four times a day (QID) | RECTAL | Status: DC | PRN

## 2020-01-14 MED ORDER — OXYBUTYNIN CHLORIDE ER 10 MG PO TB24
10.0000 mg | ORAL_TABLET | Freq: Every day | ORAL | Status: DC
  Administered 2020-01-14 – 2020-01-15 (×2): 10 mg via ORAL
  Filled 2020-01-14 (×2): qty 1

## 2020-01-14 MED ORDER — FENTANYL CITRATE (PF) 100 MCG/2ML IJ SOLN
INTRAMUSCULAR | Status: AC | PRN
  Administered 2020-01-14 (×3): 50 ug via INTRAVENOUS

## 2020-01-14 MED ORDER — OXYCODONE-ACETAMINOPHEN 5-325 MG PO TABS
1.0000 | ORAL_TABLET | ORAL | 0 refills | Status: AC | PRN
Start: 2020-01-14 — End: ?

## 2020-01-14 MED ORDER — OXYCODONE HCL 5 MG/5ML PO SOLN: 5.0000 mg | Freq: Once | ORAL | Status: DC | PRN

## 2020-01-14 MED ORDER — PHENYLEPHRINE 40 MCG/ML (10ML) SYRINGE FOR IV PUSH (FOR BLOOD PRESSURE SUPPORT)
PREFILLED_SYRINGE | INTRAVENOUS | Status: AC
  Filled 2020-01-14: qty 10

## 2020-01-14 MED ORDER — SODIUM CHLORIDE 0.9 % IV SOLN: INTRAVENOUS | Status: DC

## 2020-01-14 MED ORDER — DEXAMETHASONE SODIUM PHOSPHATE 10 MG/ML IJ SOLN
INTRAMUSCULAR | Status: DC | PRN
  Administered 2020-01-14: 10 mg via INTRAVENOUS

## 2020-01-14 MED ORDER — OXYCODONE HCL 5 MG PO TABS: 5.0000 mg | ORAL_TABLET | Freq: Once | ORAL | Status: DC | PRN

## 2020-01-14 MED ORDER — PROPOFOL 10 MG/ML IV BOLUS
INTRAVENOUS | Status: DC | PRN
  Administered 2020-01-14: 200 mg via INTRAVENOUS

## 2020-01-14 MED ORDER — IOPAMIDOL (ISOVUE-300) INJECTION 61%
INTRAVENOUS | Status: DC | PRN
  Administered 2020-01-14: 60 mL

## 2020-01-14 MED ORDER — DOCUSATE SODIUM 100 MG PO CAPS: 100.0000 mg | ORAL_CAPSULE | Freq: Every day | ORAL | 1 refills | Status: AC | PRN

## 2020-01-14 MED ORDER — MIDAZOLAM HCL 2 MG/2ML IJ SOLN
INTRAMUSCULAR | Status: AC
  Filled 2020-01-14: qty 6

## 2020-01-14 MED ORDER — MIDAZOLAM HCL 2 MG/2ML IJ SOLN
INTRAMUSCULAR | Status: AC | PRN
  Administered 2020-01-14: 2 mg via INTRAVENOUS
  Administered 2020-01-14: 1 mg via INTRAVENOUS
  Administered 2020-01-14: 2 mg via INTRAVENOUS

## 2020-01-14 MED ORDER — OXYBUTYNIN CHLORIDE 5 MG PO TABS: 5.0000 mg | ORAL_TABLET | Freq: Three times a day (TID) | ORAL | Status: DC | PRN

## 2020-01-14 MED ORDER — SUGAMMADEX SODIUM 500 MG/5ML IV SOLN
INTRAVENOUS | Status: AC
  Filled 2020-01-14: qty 5

## 2020-01-14 MED ORDER — ONDANSETRON HCL 4 MG/2ML IJ SOLN
INTRAMUSCULAR | Status: DC | PRN
  Administered 2020-01-14: 4 mg via INTRAVENOUS

## 2020-01-14 MED ORDER — PHENYLEPHRINE HCL-NACL 10-0.9 MG/250ML-% IV SOLN
INTRAVENOUS | Status: DC | PRN
  Administered 2020-01-14: 25 ug/min via INTRAVENOUS

## 2020-01-14 MED ORDER — HYDROMORPHONE HCL 1 MG/ML IJ SOLN: 0.5000 mg | INTRAMUSCULAR | Status: DC | PRN

## 2020-01-14 MED ORDER — PHENYLEPHRINE HCL (PRESSORS) 10 MG/ML IV SOLN
INTRAVENOUS | Status: AC
  Filled 2020-01-14: qty 1

## 2020-01-14 MED ORDER — HYDROMORPHONE HCL 1 MG/ML IJ SOLN: 0.2500 mg | INTRAMUSCULAR | Status: DC | PRN

## 2020-01-14 MED ORDER — SODIUM CHLORIDE 0.9 % IV SOLN: 250.0000 mL | INTRAVENOUS | Status: DC | PRN

## 2020-01-14 MED ORDER — CIPROFLOXACIN IN D5W 400 MG/200ML IV SOLN
INTRAVENOUS | Status: DC | PRN
  Administered 2020-01-14: 400 mg via INTRAVENOUS

## 2020-01-14 MED ORDER — BISACODYL 5 MG PO TBEC
5.0000 mg | DELAYED_RELEASE_TABLET | Freq: Every day | ORAL | Status: DC | PRN
  Administered 2020-01-14: 5 mg via ORAL
  Filled 2020-01-14: qty 1

## 2020-01-14 MED ORDER — ROCURONIUM BROMIDE 10 MG/ML (PF) SYRINGE
PREFILLED_SYRINGE | INTRAVENOUS | Status: AC
  Filled 2020-01-14: qty 10

## 2020-01-14 MED ORDER — OXYCODONE-ACETAMINOPHEN 5-325 MG PO TABS: 1.0000 | ORAL_TABLET | ORAL | Status: DC | PRN

## 2020-01-14 MED ORDER — FENTANYL CITRATE (PF) 100 MCG/2ML IJ SOLN
INTRAMUSCULAR | Status: AC
  Filled 2020-01-14: qty 2

## 2020-01-14 MED ORDER — DEXAMETHASONE SODIUM PHOSPHATE 10 MG/ML IJ SOLN
INTRAMUSCULAR | Status: AC
  Filled 2020-01-14: qty 1

## 2020-01-14 MED ORDER — BACITRACIN-NEOMYCIN-POLYMYXIN 400-5-5000 EX OINT: 1.0000 "application " | TOPICAL_OINTMENT | Freq: Three times a day (TID) | CUTANEOUS | Status: DC | PRN

## 2020-01-14 MED ORDER — CIPROFLOXACIN IN D5W 400 MG/200ML IV SOLN
400.0000 mg | Freq: Once | INTRAVENOUS | Status: DC
  Filled 2020-01-14: qty 200

## 2020-01-14 MED ORDER — CEFAZOLIN SODIUM-DEXTROSE 2-4 GM/100ML-% IV SOLN: 2.0000 g | Freq: Once | INTRAVENOUS | Status: DC

## 2020-01-14 MED ORDER — PROMETHAZINE HCL 25 MG/ML IJ SOLN: 6.2500 mg | INTRAMUSCULAR | Status: DC | PRN

## 2020-01-14 MED ORDER — SODIUM CHLORIDE 0.9 % IV SOLN
2.0000 g | Freq: Once | INTRAVENOUS | Status: DC
  Filled 2020-01-14: qty 2000

## 2020-01-14 MED ORDER — FENTANYL CITRATE (PF) 100 MCG/2ML IJ SOLN
INTRAMUSCULAR | Status: DC | PRN
  Administered 2020-01-14 (×2): 50 ug via INTRAVENOUS

## 2020-01-14 MED ORDER — ACETAMINOPHEN 325 MG PO TABS: 650.0000 mg | ORAL_TABLET | ORAL | Status: DC | PRN

## 2020-01-14 MED ORDER — LIDOCAINE HCL (PF) 1 % IJ SOLN
INTRAMUSCULAR | Status: AC | PRN
  Administered 2020-01-14: 5 mL

## 2020-01-14 MED ORDER — DIPHENHYDRAMINE HCL 12.5 MG/5ML PO ELIX: 12.5000 mg | ORAL_SOLUTION | Freq: Four times a day (QID) | ORAL | Status: DC | PRN

## 2020-01-14 MED ORDER — SODIUM CHLORIDE 0.9 % IR SOLN
Status: DC | PRN
  Administered 2020-01-14: 12000 mL

## 2020-01-14 MED ORDER — SODIUM CHLORIDE 0.9% FLUSH
3.0000 mL | Freq: Two times a day (BID) | INTRAVENOUS | Status: DC
  Administered 2020-01-14: 3 mL via INTRAVENOUS

## 2020-01-14 MED ORDER — ROCURONIUM BROMIDE 10 MG/ML (PF) SYRINGE
PREFILLED_SYRINGE | INTRAVENOUS | Status: DC | PRN
  Administered 2020-01-14: 70 mg via INTRAVENOUS
  Administered 2020-01-14: 20 mg via INTRAVENOUS

## 2020-01-14 MED ORDER — GENTAMICIN SULFATE 40 MG/ML IJ SOLN
5.0000 mg/kg | Freq: Once | INTRAVENOUS | Status: DC
  Filled 2020-01-14: qty 14.75

## 2020-01-14 MED ORDER — GENTAMICIN SULFATE 40 MG/ML IJ SOLN
600.0000 mg | Freq: Once | INTRAVENOUS | Status: AC
  Administered 2020-01-14: 600 mg via INTRAVENOUS
  Filled 2020-01-14: qty 15

## 2020-01-14 MED ORDER — PHENYLEPHRINE 40 MCG/ML (10ML) SYRINGE FOR IV PUSH (FOR BLOOD PRESSURE SUPPORT)
PREFILLED_SYRINGE | INTRAVENOUS | Status: DC | PRN
  Administered 2020-01-14: 120 ug via INTRAVENOUS
  Administered 2020-01-14: 160 ug via INTRAVENOUS
  Administered 2020-01-14: 120 ug via INTRAVENOUS

## 2020-01-14 MED ORDER — IOHEXOL 300 MG/ML  SOLN
50.0000 mL | Freq: Once | INTRAMUSCULAR | Status: AC | PRN
  Administered 2020-01-14: 20 mL

## 2020-01-14 MED ORDER — CEFAZOLIN SODIUM-DEXTROSE 2-3 GM-%(50ML) IV SOLR
INTRAVENOUS | Status: DC | PRN
  Administered 2020-01-14: 2 g via INTRAVENOUS

## 2020-01-14 MED ORDER — DIPHENHYDRAMINE HCL 50 MG/ML IJ SOLN: 12.5000 mg | Freq: Four times a day (QID) | INTRAMUSCULAR | Status: DC | PRN

## 2020-01-14 MED ORDER — DOCUSATE SODIUM 100 MG PO CAPS
100.0000 mg | ORAL_CAPSULE | Freq: Two times a day (BID) | ORAL | Status: DC
  Administered 2020-01-15: 100 mg via ORAL
  Filled 2020-01-14 (×2): qty 1

## 2020-01-14 MED ORDER — LIDOCAINE 2% (20 MG/ML) 5 ML SYRINGE
INTRAMUSCULAR | Status: AC
  Filled 2020-01-14: qty 5

## 2020-01-14 MED ORDER — SUGAMMADEX SODIUM 500 MG/5ML IV SOLN
INTRAVENOUS | Status: DC | PRN
  Administered 2020-01-14: 400 mg via INTRAVENOUS

## 2020-01-14 MED ORDER — SODIUM CHLORIDE 0.9% FLUSH: 3.0000 mL | INTRAVENOUS | Status: DC | PRN

## 2020-01-14 MED ORDER — MIDAZOLAM HCL 5 MG/5ML IJ SOLN
INTRAMUSCULAR | Status: DC | PRN
  Administered 2020-01-14: 2 mg via INTRAVENOUS

## 2020-01-14 MED ORDER — MIDAZOLAM HCL 2 MG/2ML IJ SOLN
INTRAMUSCULAR | Status: AC
  Filled 2020-01-14: qty 2

## 2020-01-14 MED ORDER — CEPHALEXIN 500 MG PO CAPS: 500.0000 mg | ORAL_CAPSULE | Freq: Two times a day (BID) | ORAL | 0 refills | Status: AC

## 2020-01-14 MED ORDER — LACTATED RINGERS IV SOLN: INTRAVENOUS | Status: DC

## 2020-01-14 MED ORDER — ONDANSETRON HCL 4 MG/2ML IJ SOLN
INTRAMUSCULAR | Status: AC
  Filled 2020-01-14: qty 2

## 2020-01-14 MED ORDER — ONDANSETRON HCL 4 MG/2ML IJ SOLN: 4.0000 mg | INTRAMUSCULAR | Status: DC | PRN

## 2020-01-14 SURGICAL SUPPLY — 73 items
AGENT HMST KT MTR STRL THRMB (HEMOSTASIS)
APL PRP STRL LF DISP 70% ISPRP (MISCELLANEOUS) ×2
APL SKNCLS STERI-STRIP NONHPOA (GAUZE/BANDAGES/DRESSINGS) ×1
BAG DRN RND TRDRP ANRFLXCHMBR (UROLOGICAL SUPPLIES)
BAG URINE DRAIN 2000ML AR STRL (UROLOGICAL SUPPLIES) IMPLANT
BAG URO CATCHER STRL LF (MISCELLANEOUS) ×1 IMPLANT
BASKET LASER NITINOL 1.9FR (BASKET) IMPLANT
BASKET ZERO TIP NITINOL 2.4FR (BASKET) ×1 IMPLANT
BENZOIN TINCTURE PRP APPL 2/3 (GAUZE/BANDAGES/DRESSINGS) ×2 IMPLANT
BLADE SURG 15 STRL LF DISP TIS (BLADE) ×1 IMPLANT
BLADE SURG 15 STRL SS (BLADE) ×2
BSKT STON RTRVL 120 1.9FR (BASKET)
BSKT STON RTRVL ZERO TP 2.4FR (BASKET) ×1
CATH FOLEY 2W COUNCIL 20FR 5CC (CATHETERS) ×1 IMPLANT
CATH FOLEY 2WAY SLVR  5CC 16FR (CATHETERS) ×2
CATH FOLEY 2WAY SLVR 5CC 16FR (CATHETERS) ×1 IMPLANT
CATH INTERMIT  6FR 70CM (CATHETERS) IMPLANT
CATH MULTI PURPOSE 16FR DRAIN (CATHETERS) ×1 IMPLANT
CATH ROBINSON RED A/P 20FR (CATHETERS) IMPLANT
CATH ULTRATHANE 14FR (CATHETERS) IMPLANT
CATH URET 5FR 28IN OPEN ENDED (CATHETERS) ×2 IMPLANT
CATH URET DUAL LUMEN 6-10FR 50 (CATHETERS) ×2 IMPLANT
CATH X-FORCE N30 NEPHROSTOMY (TUBING) IMPLANT
CHLORAPREP W/TINT 26 (MISCELLANEOUS) ×4 IMPLANT
COVER WAND RF STERILE (DRAPES) IMPLANT
DRAPE C-ARM 42X120 X-RAY (DRAPES) ×2 IMPLANT
DRAPE LINGEMAN PERC (DRAPES) ×2 IMPLANT
DRAPE SHEET LG 3/4 BI-LAMINATE (DRAPES) IMPLANT
DRAPE SURG IRRIG POUCH 19X23 (DRAPES) ×2 IMPLANT
DRSG PAD ABDOMINAL 8X10 ST (GAUZE/BANDAGES/DRESSINGS) ×4 IMPLANT
DRSG TEGADERM 4X4.75 (GAUZE/BANDAGES/DRESSINGS) IMPLANT
DRSG TEGADERM 8X12 (GAUZE/BANDAGES/DRESSINGS) ×3 IMPLANT
EXTRACTOR STONE PERC NCIRCLE (MISCELLANEOUS) IMPLANT
GAUZE SPONGE 4X4 12PLY STRL (GAUZE/BANDAGES/DRESSINGS) ×2 IMPLANT
GLOVE BIOGEL M STRL SZ7.5 (GLOVE) ×2 IMPLANT
GOWN STRL REUS W/TWL LRG LVL3 (GOWN DISPOSABLE) ×2 IMPLANT
GUIDEWIRE AMPLAZ .035X145 (WIRE) ×4 IMPLANT
GUIDEWIRE STR DUAL SENSOR (WIRE) ×1 IMPLANT
GUIDEWIRE SUPER STIFF (WIRE) ×3 IMPLANT
GUIDEWIRE ZIPWRE .038 STRAIGHT (WIRE) ×2 IMPLANT
IV SET EXTENSION CATH 6 NF (IV SETS) IMPLANT
KIT BASIN OR (CUSTOM PROCEDURE TRAY) ×2 IMPLANT
KIT PROBE 340X3.4XDISP GRN (MISCELLANEOUS) IMPLANT
KIT PROBE TRILOGY 3.4X340 (MISCELLANEOUS) ×2
KIT PROBE TRILOGY 3.9X350 (MISCELLANEOUS) ×2 IMPLANT
KIT TURNOVER KIT A (KITS) IMPLANT
LASER FIB FLEXIVA PULSE ID 365 (Laser) IMPLANT
MANIFOLD NEPTUNE II (INSTRUMENTS) ×2 IMPLANT
NDL TROCAR 18X15 ECHO (NEEDLE) IMPLANT
NDL TROCAR 18X20 (NEEDLE) IMPLANT
NEEDLE TROCAR 18X15 ECHO (NEEDLE) IMPLANT
NEEDLE TROCAR 18X20 (NEEDLE) IMPLANT
NS IRRIG 1000ML POUR BTL (IV SOLUTION) ×2 IMPLANT
PACK CYSTO (CUSTOM PROCEDURE TRAY) IMPLANT
SHEATH PEELAWAY SET 9 (SHEATH) IMPLANT
SPONGE LAP 4X18 RFD (DISPOSABLE) ×2 IMPLANT
STENT URET 6FRX26 CONTOUR (STENTS) ×1 IMPLANT
SURGIFLO W/THROMBIN 8M KIT (HEMOSTASIS) IMPLANT
SUT SILK 0 FSL (SUTURE) IMPLANT
SUT VIC AB 4-0 PS2 18 (SUTURE) IMPLANT
SYR 10ML LL (SYRINGE) ×2 IMPLANT
SYR 20ML LL LF (SYRINGE) ×4 IMPLANT
SYR 50ML LL SCALE MARK (SYRINGE) ×2 IMPLANT
TOWEL OR 17X26 10 PK STRL BLUE (TOWEL DISPOSABLE) ×2 IMPLANT
TRACTIP FLEXIVA PULS ID 200XHI (Laser) IMPLANT
TRACTIP FLEXIVA PULSE ID 200 (Laser) ×2
TRAY FOLEY MTR SLVR 16FR STAT (SET/KITS/TRAYS/PACK) ×2 IMPLANT
TUBE CONNECTING VINYL 14FR 30C (TUBING) IMPLANT
TUBING CONNECTING 10 (TUBING) ×4 IMPLANT
TUBING STONE CATCHER TRILOGY (MISCELLANEOUS) ×2 IMPLANT
TUBING UROLOGY SET (TUBING) ×1 IMPLANT
WATER STERILE IRR 1000ML POUR (IV SOLUTION) ×2 IMPLANT
WATER STERILE IRR 3000ML UROMA (IV SOLUTION) ×2 IMPLANT

## 2020-01-14 NOTE — Procedures (Signed)
Pre Procedure Dx: Nephrolithiasis Post Procedural Dx: Same  Successful [place] and fluoroscopic guided placement of a left sided nephroureteral catheter to be utilized during impending PCNL.  Catheter tip within the urinary bladder.  EBL: Minimal Complications: None immediate.  Katherina Right, MD Pager #: [phone]

## 2020-01-14 NOTE — Progress Notes (Signed)
Pharmacy Antibiotic Note  Gregg Pierce is a 59 y.o. male admitted on 01/14/2020 with L renal stones s/p nephrolithotomy.  Pharmacy has been consulted for gentamicin dosing.  Plan: Gentamicin 5 mg/kg IV x 1 dose Pharmacy to sign off  Height: 6' (182.9 cm) Weight: 117.9 kg (260 lb) IBW/kg (Calculated) : 77.6  Temp (24hrs), Avg:97.9 F (36.6 C), Min:97.7 F (36.5 C), Max:98 F (36.7 C)  Recent Labs  Lab 01/14/20 0758 01/14/20 1353  WBC 8.2 8.2    CrCl cannot be calculated (Patient's most recent lab result is older than the maximum 21 days allowed.).    Allergies  Allergen Reactions  . Bee Venom Hives  . Lipitor [Atorvastatin] Other (See Comments)    myalgias    Thank you for allowing pharmacy to be a part of this patient's care.  Herby Abraham, Pharm.D 01/14/2020 2:23 PM

## 2020-01-14 NOTE — Discharge Instructions (Signed)
·   Activity:  You are encouraged to ambulate frequently (about every hour during waking hours) to help prevent blood clots from forming in your legs or lungs.  However, you should not engage in any heavy lifting (> 10-15 lbs), strenuous activity, or straining.   Diet: You should advance your diet as instructed by your physician.  It will be normal to have some bloating, nausea, and abdominal discomfort intermittently.   Prescriptions:  You will be provided a prescription for pain medication to take as needed.  If your pain is not severe enough to require the prescription pain medication, you may take extra strength Tylenol instead which will have less side effects.  You should also take a prescribed stool softener to avoid straining with bowel movements as the prescription pain medication may constipate you.   What to call us about: You should call the office [phone]) if you develop fever > 101 or develop persistent vomiting. Activity:  You are encouraged to ambulate frequently (about every hour during waking hours) to help prevent blood clots from forming in your legs or lungs.  However, you should not engage in any heavy lifting (> 10-15 lbs), strenuous activity, or straining.  You have a left ureteral stent draining your kidney. This will remain in place until followup in 2 weeks.
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
## 2020-01-14 NOTE — Anesthesia Procedure Notes (Signed)
Procedure Name: Intubation Date/Time: 01/14/2020 11:09 AM Performed by: Gerald Leitz, CRNA Pre-anesthesia Checklist: Patient identified, Patient being monitored, Timeout performed, Emergency Drugs available and Suction available Patient Re-evaluated:Patient Re-evaluated prior to induction Oxygen Delivery Method: Circle system utilized Preoxygenation: Pre-oxygenation with 100% oxygen Induction Type: IV induction Ventilation: Mask ventilation without difficulty Laryngoscope Size: Mac and 3 Grade View: Grade I Tube type: Oral Tube size: 7.5 mm Number of attempts: 1 Placement Confirmation: ETT inserted through vocal cords under direct vision,  positive ETCO2 and breath sounds checked- equal and bilateral Secured at: 24 cm Tube secured with: Tape Dental Injury: Teeth and Oropharynx as per pre-operative assessment

## 2020-01-14 NOTE — Anesthesia Postprocedure Evaluation (Signed)
Anesthesia Post Note  Patient: Gregg Pierce  Procedure(s) Performed: NEPHROLITHOTOMY PERCUTANEOUS (Left )     Patient location during evaluation: PACU Anesthesia Type: General Level of consciousness: awake and alert Pain management: pain level controlled Vital Signs Assessment: post-procedure vital signs reviewed and stable Respiratory status: spontaneous breathing, nonlabored ventilation, respiratory function stable and patient connected to nasal cannula oxygen Cardiovascular status: blood pressure returned to baseline and stable Postop Assessment: no apparent nausea or vomiting Anesthetic complications: no   No complications documented.  Last Vitals:  Vitals:   01/14/20 1415 01/14/20 1430  BP: (!) 155/96 (!) 148/92  Pulse: 89 84  Resp: 16 (!) 21  Temp:  36.4 C  SpO2: 97% 99%    Last Pain:  Vitals:   01/14/20 1430  TempSrc:   PainSc: 0-No pain                 Kenzey Birkland S

## 2020-01-14 NOTE — Plan of Care (Signed)
  Problem: Education: Goal: Required Educational Video(s) Outcome: Progressing   Problem: Clinical Measurements: Goal: Postoperative complications will be avoided or minimized Outcome: Progressing   Problem: Skin Integrity: Goal: Demonstration of wound healing without infection will improve Outcome: Progressing   
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
## 2020-01-14 NOTE — Anesthesia Preprocedure Evaluation (Signed)
Anesthesia Evaluation  Patient identified by MRN, date of birth, ID band Patient awake    Reviewed: Allergy & Precautions, NPO status , Patient's Chart, lab work & pertinent test results  Airway Mallampati: II  TM Distance: >3 FB Neck ROM: Full    Dental no notable dental hx.    Pulmonary neg pulmonary ROS, former smoker,    Pulmonary exam normal breath sounds clear to auscultation       Cardiovascular hypertension, Normal cardiovascular exam Rhythm:Regular Rate:Normal     Neuro/Psych negative neurological ROS  negative psych ROS   GI/Hepatic Neg liver ROS, GERD  ,  Endo/Other  negative endocrine ROS  Renal/GU negative Renal ROS  negative genitourinary   Musculoskeletal negative musculoskeletal ROS (+)   Abdominal   Peds negative pediatric ROS (+)  Hematology negative hematology ROS (+)   Anesthesia Other Findings   Reproductive/Obstetrics negative OB ROS                             Anesthesia Physical Anesthesia Plan  ASA: II  Anesthesia Plan: General   Post-op Pain Management:    Induction: Intravenous  PONV Risk Score and Plan: 2 and Ondansetron, Dexamethasone and Treatment may vary due to age or medical condition  Airway Management Planned: Oral ETT  Additional Equipment:   Intra-op Plan:   Post-operative Plan: Extubation in OR  Informed Consent: I have reviewed the patients History and Physical, chart, labs and discussed the procedure including the risks, benefits and alternatives for the proposed anesthesia with the patient or authorized representative who has indicated his/her understanding and acceptance.     Dental advisory given  Plan Discussed with: CRNA and Surgeon  Anesthesia Plan Comments:         Anesthesia Quick Evaluation  

## 2020-01-14 NOTE — Op Note (Addendum)
Operative Note  Preoperative diagnosis:  1.  Left renal stones  Postoperative diagnosis: 1.  Left renal stones  Procedure(s): 1.  Left percutaneous nephrolithotomy (greater than 2 cm) 2. Dilation of left renal percutaneous tract under fluoroscopy 3. Left antegrade ureteroscopy with laser lithotripsy and basket stone extraction 4. Left antegrade nephrostogram 5. Fluoroscopy time less than 1 hour with interpretation 6. Left ureteral stent exchange  Surgeon: Jettie Pagan, MD  Assistants:  None  Anesthesia:  General  Complications:  None  EBL:  60ml  Specimens: 1. Left renal stones - sent to [facility] for analysis  Drains/Catheters: 1.   A 16-French Foley. 2. A 6 French by 26 cm left ureteral stent  Intraoperative findings:   1. 4 separate stones each measuring approximately 1 cm all removed either trilogy, basket extractor or laser lithotripsy with extraction.  Minimal bleeding with access into the left posterior interpolar calyx.  No stone fragments identified along the course of the ureter at the end of the case.  Successful left ureteral stent placement with curl within the bladder and renal pelvis.  Indication:  Gregg Pierce is a 59 y.o. male with a history of urolithiasis. Their stone burden included 4 separate stones with total stone burden greater than 2 cm each time was approximately 1 cm.. After a thorough discussion of the risks, benefits, and alternatives of the surgery, pt agreed to proceed.  In the morning of surgery, pt went down to Interventional Radiology to get an access placed and nephroscopy films were reviewed prior to surgery today. They had good access with a 4-French nephroureteral catheter going all the way down to the bladder.  Of note the skin to stone distance was approximately 17 cm as he had a malrotated kidney.  Description of procedure: After informed consent was obtained from the patient, the patient was identified, the patient was brought to the  operating room and placed in supine position.   General anesthesia was administered as well as perioperative IV antibiotics with ciprofloxacin and Ancef.  At the beginning of the case, a time-out was performed to properly identify the patient, the surgery to be performed, and the surgical site. Sequential compression devices were applied to lower extremities at the beginning of the case for DVT prophylaxis.   The patient was then placed into a prone position onto gel rolls, making sure that all pressure points were properly padded.  The patient's left flank and genitalia were then prepped and draped in sterile fashion.    We placed a Foley sterilely in the patient in prone position.  A superstiff wire was passed through the 4-French open-ended catheter down to the bladder, which we could see under fluoroscopy. We made an incision around this wire. We then dilated the tract using a dual lumen. An antegrade nephrostogram was performed which demonstrated appropriate position IV access within the left posterior interpolar calyx. We then passed a separate Super Stiff wire into the bladder.  Of note the skin to stone distance was approximately 17 cm so I used the extra long sheath.  The Bard X force balloon was then passed over the superstiff wire until the radiopaque tip was well into the posterior interpolar calyx.  We dilated this balloon to 18 cm of water pressure.  We advanced the sheath over the balloon.  We deflated the balloon, leaving the wire in place.   The rigid nephroscope and the Trilogy were inserted.  This point is apparent that we are still within the perinephric  fat.  I then reintroduced the into the calyx and read inflated up to 18 cm of water pressure.  I advanced the sheath just a couple centimeters more.  We then deflated the balloon leaving the wire in place.  I then inserted the rigid nephroscope and trilogy into the kidney. The stent was encountered and removed with a grasper. The stone  burden was encountered. The Trilogy was used to fragement and suction out the stones. The Perc N-circle was used to remove large fragments. All fragments were removed.  The or 3 separate large stones within the kidney that were evacuated.  Flexible nephroscopy was performed revealing no further fragments.  I then advanced a digital ureteroscope into the ureter or encountered another 1 cm stone lodged within the UPJ.  I did use a basket to extract this in an antegrade fashion however this was quite large and I did not want to traumatize the ureter so elected to treat this with laser lithotripsy.  A 2 to 42 m holmium laser fiber then inserted and the stone was fragmented in half.  I used a 0 tip basket to remove the stone fragments and the larger stone.  I then advanced the ureteroscope all the way into the bladder and noted no further stone fragments along the course of the ureter.   Repeat retrograde pyeloscopy was performed using the ureteroscope. No further stones were noted. We then passed a 0.038 zip wire through the ureteroscope into the bladder.  A 6 fr x 26 cm stent was then placed in the standard fashion using fluoroscopic guidance.  There was a nice coil within the bladder and renal pelvis.  A repeat antegrade nephrostogram was performed demonstrating no filling defects and no extravasation of contrast.  We then removed the Super Stiff guidewires and sheath.  There is no significant bleeding.   The incision was closed with a 2-0 vicryl suture.   Dermabond was applied.   At this point, the procedure was completed. He was then transferred to the supine position and recovery room in stable condition. The patient tolerated the procedure well.  There were no immediate complications.  Plan:  Patient will be observed overnight. CT A/P non-con will be obtained to assess remaining stone burden.  We will plan for discharge home tomorrow if no acute issues.  Gregg R. Dawsyn Zurn MD [facility]  Pager:  [phone]

## 2020-01-14 NOTE — Transfer of Care (Signed)
Immediate Anesthesia Transfer of Care Note  Patient: Gregg Pierce  Procedure(s) Performed: Procedure(s): NEPHROLITHOTOMY PERCUTANEOUS (Left)  Patient Location: PACU  Anesthesia Type:General  Level of Consciousness: Alert, Awake, Oriented  Airway & Oxygen Therapy: Patient Spontanous Breathing  Post-op Assessment: Report given to RN  Post vital signs: Reviewed and stable  Last Vitals:  Vitals:   01/14/20 0750  BP: (!) 171/98  Pulse: 89  Resp: 18  Temp: 36.7 C  SpO2: 98%    Complications: No apparent anesthesia complications

## 2020-01-15 ENCOUNTER — Observation Stay (HOSPITAL_COMMUNITY): Payer: Commercial Managed Care - PPO

## 2020-01-15 ENCOUNTER — Encounter (HOSPITAL_COMMUNITY): Payer: Self-pay | Admitting: Urology

## 2020-01-15 DIAGNOSIS — N200 Calculus of kidney: Secondary | ICD-10-CM | POA: Diagnosis not present

## 2020-01-15 LAB — BASIC METABOLIC PANEL
Anion gap: 10 (ref 5–15)
Anion gap: 8 (ref 5–15)
BUN: 15 mg/dL (ref 6–20)
BUN: 16 mg/dL (ref 6–20)
CO2: 20 mmol/L — ABNORMAL LOW (ref 22–32)
CO2: 27 mmol/L (ref 22–32)
Calcium: 8.5 mg/dL — ABNORMAL LOW (ref 8.9–10.3)
Calcium: 8.9 mg/dL (ref 8.9–10.3)
Chloride: 103 mmol/L (ref 98–111)
Chloride: 102 mmol/L (ref 98–111)
Creatinine, Ser: 0.85 mg/dL (ref 0.61–1.24)
Creatinine, Ser: 0.91 mg/dL (ref 0.61–1.24)
GFR, Estimated: >60 mL/min (ref >60)
GFR, Estimated: >60 mL/min (ref >60)
Glucose, Bld: 134 mg/dL — ABNORMAL HIGH (ref 70–99)
Glucose, Bld: 116 mg/dL — ABNORMAL HIGH (ref 70–99)
Potassium: 4 mmol/L (ref 3.5–5.1)
Potassium: 3.9 mmol/L (ref 3.5–5.1)
Sodium: 133 mmol/L — ABNORMAL LOW (ref 135–145)
Sodium: 137 mmol/L (ref 135–145)

## 2020-01-15 LAB — CBC
HCT: 41.7 % (ref 39.0–52.0)
Hemoglobin: 14.3 g/dL (ref 13.0–17.0)
MCH: 29.9 pg (ref 26.0–34.0)
MCHC: 34.3 g/dL (ref 30.0–36.0)
MCV: 87.2 fL (ref 80.0–100.0)
Platelets: 238 10*3/uL (ref 150–400)
RBC: 4.78 MIL/uL (ref 4.22–5.81)
RDW: 11.6 % (ref 11.5–15.5)
WBC: 12.9 10*3/uL — ABNORMAL HIGH (ref 4.0–10.5)
nRBC: 0 % (ref 0.0–0.2)

## 2020-01-15 MED ORDER — TAMSULOSIN HCL 0.4 MG PO CAPS
0.4000 mg | ORAL_CAPSULE | Freq: Every day | ORAL | Status: DC
  Administered 2020-01-15: 0.4 mg via ORAL
  Filled 2020-01-15: qty 1

## 2020-01-15 MED ORDER — SODIUM CHLORIDE 0.9% FLUSH: 3.0000 mL | INTRAVENOUS | Status: DC | PRN

## 2020-01-15 MED ORDER — SODIUM CHLORIDE 0.9 % IV SOLN: 250.0000 mL | INTRAVENOUS | Status: DC | PRN

## 2020-01-15 MED ORDER — SODIUM CHLORIDE 0.9% FLUSH: 3.0000 mL | Freq: Two times a day (BID) | INTRAVENOUS | Status: DC

## 2020-01-15 NOTE — Discharge Summary (Signed)
Date of admission: 01/14/2020  Date of discharge: 01/15/2020  Admission diagnosis: Left renal stones  Discharge diagnosis: Left renal stones  Secondary diagnoses: none  History and Physical: For full details, please see admission history and physical. Briefly, Gregg Pierce is a 59 y.o. year old patient with left renal stones who underwent L PCNL on 01/15/2020.   Hospital Course: The patient recovered in the usual expected fashion.  He had his diet advanced slowly.  Initially managed with IV pain control, then transitioned to PO meds when he was tolerating oral intake.  His labs were stable throughout the hospital course.  He was discharged to home on POD#1. Post-op showed no residual stones in the left kidney. He had expected post-surgical changes. At the time of discharge the patient was tolerating a regular diet, passing flatus, ambulating, had adequate pain control and was agreeable to discharge.  Follow up as scheduled.    Laboratory values:  Recent Labs    01/14/20 0758 01/14/20 1353 01/15/20 0547  HGB 15.8 14.6 14.3  HCT 46.1 43.1 41.7   Recent Labs    01/15/20 0547 01/15/20 1017  CREATININE 0.85 0.91    Disposition: Home  Discharge instruction: The patient was instructed to be ambulatory but told to refrain from heavy lifting, strenuous activity, or driving. F/u in office in 2 weeks for stent pull.  Discharge medications:  Allergies as of 01/15/2020      Reactions   Bee Venom Hives   Lipitor [atorvastatin] Other (See Comments)   myalgias      Medication List    TAKE these medications   cephALEXin 500 MG capsule Commonly known as: KEFLEX Take 1 capsule (500 mg total) by mouth 2 (two) times daily for 3 days. Take the day before, day of and day after stent removal in office in 2 weeks.   docusate sodium 100 MG capsule Commonly known as: Colace Take 1 capsule (100 mg total) by mouth daily as needed.   oxyCODONE-acetaminophen 5-325 MG tablet Commonly known  as: Percocet Take 1 tablet by mouth every 4 (four) hours as needed for up to 12 doses for severe pain. What changed:   how much to take  when to take this   tamsulosin 0.4 MG Caps capsule Commonly known as: Flomax Take 1 capsule (0.4 mg total) by mouth daily.       Followup:   Follow-up Information    [facility] On 01/28/2020.   Why: 2:30PM for stent removal Contact information: [address] [place] [address] [phone]              Matt R. Audrey Eller MD [facility]  Pager: [phone]

## 2020-01-15 NOTE — Progress Notes (Signed)
Reviewed AVS.  No questions.  Alert and oriented, skin warm and dry.  Trnasferred to the care of his wife without difficulty.
